# Patient Record
Sex: Female | Born: 1970
Health system: Southern US, Community
[De-identification: ages and names within clinical notes are randomized; demographics above are authoritative.]

## PROBLEM LIST (undated history)

## (undated) DIAGNOSIS — K5792 Diverticulitis of intestine, part unspecified, without perforation or abscess without bleeding: Secondary | ICD-10-CM

## (undated) HISTORY — PX: CHOLECYSTECTOMY: SHX55

---

## 2004-10-25 ENCOUNTER — Emergency Department: Payer: Self-pay | Admitting: Emergency Medicine

## 2013-10-04 ENCOUNTER — Ambulatory Visit: Payer: Self-pay | Admitting: Gastroenterology

## 2015-11-09 DIAGNOSIS — M7061 Trochanteric bursitis, right hip: Secondary | ICD-10-CM | POA: Diagnosis not present

## 2015-11-09 DIAGNOSIS — M5416 Radiculopathy, lumbar region: Secondary | ICD-10-CM | POA: Diagnosis not present

## 2015-11-20 DIAGNOSIS — D229 Melanocytic nevi, unspecified: Secondary | ICD-10-CM | POA: Diagnosis not present

## 2015-11-20 DIAGNOSIS — D225 Melanocytic nevi of trunk: Secondary | ICD-10-CM | POA: Diagnosis not present

## 2015-11-20 DIAGNOSIS — D281 Benign neoplasm of vagina: Secondary | ICD-10-CM | POA: Diagnosis not present

## 2015-12-18 DIAGNOSIS — M5412 Radiculopathy, cervical region: Secondary | ICD-10-CM | POA: Diagnosis not present

## 2015-12-18 DIAGNOSIS — M9901 Segmental and somatic dysfunction of cervical region: Secondary | ICD-10-CM | POA: Diagnosis not present

## 2015-12-20 ENCOUNTER — Emergency Department
Admission: EM | Admit: 2015-12-20 | Discharge: 2015-12-20 | Disposition: A | Discharge status: Home / Self Care | Payer: BLUE CROSS/BLUE SHIELD | Attending: Emergency Medicine | Admitting: Emergency Medicine

## 2015-12-20 ENCOUNTER — Emergency Department: Payer: BLUE CROSS/BLUE SHIELD

## 2015-12-20 ENCOUNTER — Encounter: Payer: Self-pay | Admitting: Emergency Medicine

## 2015-12-20 DIAGNOSIS — Y999 Unspecified external cause status: Secondary | ICD-10-CM | POA: Insufficient documentation

## 2015-12-20 DIAGNOSIS — Y939 Activity, unspecified: Secondary | ICD-10-CM | POA: Insufficient documentation

## 2015-12-20 DIAGNOSIS — M542 Cervicalgia: Secondary | ICD-10-CM | POA: Diagnosis not present

## 2015-12-20 DIAGNOSIS — F17200 Nicotine dependence, unspecified, uncomplicated: Secondary | ICD-10-CM | POA: Insufficient documentation

## 2015-12-20 DIAGNOSIS — X58XXXA Exposure to other specified factors, initial encounter: Secondary | ICD-10-CM | POA: Diagnosis not present

## 2015-12-20 DIAGNOSIS — Y929 Unspecified place or not applicable: Secondary | ICD-10-CM | POA: Diagnosis not present

## 2015-12-20 DIAGNOSIS — S46912A Strain of unspecified muscle, fascia and tendon at shoulder and upper arm level, left arm, initial encounter: Secondary | ICD-10-CM | POA: Insufficient documentation

## 2015-12-20 DIAGNOSIS — M25512 Pain in left shoulder: Secondary | ICD-10-CM | POA: Diagnosis not present

## 2015-12-20 MED ORDER — HYDROCODONE-ACETAMINOPHEN 5-325 MG PO TABS: 1.0000 | ORAL_TABLET | ORAL | Status: AC | PRN

## 2015-12-20 MED ORDER — CYCLOBENZAPRINE HCL 10 MG PO TABS: 10.0000 mg | ORAL_TABLET | Freq: Three times a day (TID) | ORAL | Status: AC | PRN

## 2015-12-20 MED ORDER — KETOROLAC TROMETHAMINE 60 MG/2ML IM SOLN
60.0000 mg | Freq: Once | INTRAMUSCULAR | Status: AC
  Administered 2015-12-20: 60 mg via INTRAMUSCULAR
  Filled 2015-12-20: qty 2

## 2015-12-20 MED ORDER — IBUPROFEN 800 MG PO TABS: 800.0000 mg | ORAL_TABLET | Freq: Three times a day (TID) | ORAL | Status: AC | PRN

## 2015-12-20 MED ORDER — DIAZEPAM 5 MG/ML IJ SOLN
INTRAMUSCULAR | Status: AC
  Administered 2015-12-20: 5 mg via INTRAMUSCULAR
  Filled 2015-12-20: qty 2

## 2015-12-20 MED ORDER — DIAZEPAM 5 MG/ML IJ SOLN
5.0000 mg | Freq: Once | INTRAMUSCULAR | Status: AC
  Administered 2015-12-20: 5 mg via INTRAMUSCULAR

## 2015-12-20 NOTE — Discharge Instructions (Signed)

## 2015-12-20 NOTE — ED Notes (Signed)
Pt from home c/o neck pain since yesterday. Pt states the pain is on the left side of the neck radiating to front of neck and down her back. Pt states she also has a rash on her left leg. Pt alert & oriented with mild distress noted.

## 2015-12-20 NOTE — ED Provider Notes (Signed)
[facility] Emergency Department Provider Note  ____________________________________________  Time seen: Approximately 10:30 AM  I have reviewed the triage vital signs and the nursing notes.   HISTORY  Chief Complaint Neck Pain and Shoulder Pain    HPI Miranda Daniels is a 45 y.o. female presents for evaluation of sudden onset left shoulder and arm pain and neck pain that started around 9:30 last night. Patient states that very tender to touch and worse with movement. Patient states that she didn't no excessive lifting yesterday and over the subsequent. Her left some milk but she said that he onset pain pain is worse when she tries to rotate her neck or when she tries to extend or abduct her arm. Rates her pain is 6/10 at this time.   History reviewed. No pertinent past medical history.  There are no active problems to display for this patient.   Past Surgical History  Procedure Laterality Date  . Cesarean section    . Cholecystectomy      Current Outpatient Rx  Name  Route  Sig  Dispense  Refill  . cyclobenzaprine (FLEXERIL) 10 MG tablet   Oral   Take 1 tablet (10 mg total) by mouth every 8 (eight) hours as needed for muscle spasms.   30 tablet   1   . HYDROcodone-acetaminophen (NORCO) 5-325 MG tablet   Oral   Take 1-2 tablets by mouth every 4 (four) hours as needed for moderate pain.   10 tablet   0   . ibuprofen (ADVIL,MOTRIN) 800 MG tablet   Oral   Take 1 tablet (800 mg total) by mouth every 8 (eight) hours as needed.   30 tablet   0     Allergies Prednisone  History reviewed. No pertinent family history.  Social History Social History  Substance Use Topics  . Smoking status: Current Every Day Smoker -- 1.00 packs/day  . Smokeless tobacco: None  . Alcohol Use: No     Comment: occasional    Review of Systems Constitutional: No fever/chills Eyes: No visual changes. ENT: No sore throat. Cardiovascular: Denies chest  pain. Respiratory: Denies shortness of breath. Musculoskeletal: Positive for neck and left shoulder pain. Skin: Negative for rash. Neurological: Negative for headaches, focal weakness or numbness.  10-point ROS otherwise negative.  ____________________________________________   PHYSICAL EXAM:  VITAL SIGNS: ED Triage Vitals  Enc Vitals Group     BP 12/20/15 0930 151/79 mmHg     Pulse Rate 12/20/15 0930 85     Resp 12/20/15 0930 22     Temp 12/20/15 0930 98.1 F (36.7 C)     Temp Source 12/20/15 0930 Oral     SpO2 12/20/15 0930 96 %     Weight 12/20/15 0930 284 lb (128.822 kg)     Height 12/20/15 0930  (1.676 m)     Head Cir --      Peak Flow --      Pain Score 12/20/15 0930 6     Pain Loc --      Pain Edu? --      Excl. in GC? --     Constitutional: Alert and oriented. Well appearing and in no acute distress. Eyes: Conjunctivae are normal. PERRL. EOMI. Head: Atraumatic. Nose: No congestion/rhinnorhea. Mouth/Throat: Mucous membranes are moist.  Oropharynx non-erythematous. Neck: No stridor.  Range of motion increased pain with lateral abduction to the left. Cardiovascular: Normal rate, regular rhythm. Grossly normal heart sounds.  Good peripheral circulation. Distally  neurovascularly intact upper extremity and lower extremity Respiratory: Normal respiratory effort.  No retractions. Lungs CTAB. Musculoskeletal: No lower extremity tenderness nor edema.  No joint effusions. Neurologic:  Normal speech and language. No gross focal neurologic deficits are appreciated. No gait instability. Skin:  Skin is warm, dry and intact. No rash noted. Psychiatric: Mood and affect are normal. Speech and behavior are normal.  ____________________________________________   LABS (all labs ordered are listed, but only abnormal results are displayed)  Labs Reviewed - No data to display ____________________________________________  EKG  Normal sinus rhythm with no acute  STEMI. ____________________________________________  RADIOLOGY  Left shoulder negative for any acute osseous findings. ____________________________________________   PROCEDURES  Procedure(s) performed: None  Critical Care performed: No  ____________________________________________   INITIAL IMPRESSION / ASSESSMENT AND PLAN / ED COURSE  Pertinent labs & imaging results that were available during my care of the patient were reviewed by me and considered in my medical decision making (see chart for details).  Acute cervical myofascial strain with radiation down to the arm. Patient was given Toradol 60 mg and I am 5 mg IM while in the ED. ____________________________________________   FINAL CLINICAL IMPRESSION(S) / ED DIAGNOSES  Final diagnoses:  Shoulder strain, left, initial encounter     This chart was dictated using voice recognition software/Dragon. Despite best efforts to proofread, errors can occur which can change the meaning. Any change was purely unintentional.   Evangeline Dakinharles M Sagal Gayton, PA-C 12/20/15 1108  Arnaldo NatalPaul F Malinda, MD 12/20/15 [phone]

## 2015-12-20 NOTE — ED Notes (Signed)
Pt with arm, neck, Left shoulder and left arm pain that started suddenly last night around 2130.  Pain remains and patient is very tender to touch and when moving.

## 2015-12-27 DIAGNOSIS — L299 Pruritus, unspecified: Secondary | ICD-10-CM | POA: Diagnosis not present

## 2015-12-27 DIAGNOSIS — M722 Plantar fascial fibromatosis: Secondary | ICD-10-CM | POA: Diagnosis not present

## 2015-12-27 DIAGNOSIS — M7731 Calcaneal spur, right foot: Secondary | ICD-10-CM | POA: Diagnosis not present

## 2015-12-27 DIAGNOSIS — M79671 Pain in right foot: Secondary | ICD-10-CM | POA: Diagnosis not present

## 2015-12-27 DIAGNOSIS — B354 Tinea corporis: Secondary | ICD-10-CM | POA: Diagnosis not present

## 2015-12-27 DIAGNOSIS — M79672 Pain in left foot: Secondary | ICD-10-CM | POA: Diagnosis not present

## 2016-01-11 DIAGNOSIS — R52 Pain, unspecified: Secondary | ICD-10-CM | POA: Diagnosis not present

## 2016-01-11 DIAGNOSIS — L259 Unspecified contact dermatitis, unspecified cause: Secondary | ICD-10-CM | POA: Diagnosis not present

## 2016-01-24 DIAGNOSIS — M722 Plantar fascial fibromatosis: Secondary | ICD-10-CM | POA: Diagnosis not present

## 2016-01-24 DIAGNOSIS — M76821 Posterior tibial tendinitis, right leg: Secondary | ICD-10-CM | POA: Diagnosis not present

## 2016-01-24 DIAGNOSIS — M7751 Other enthesopathy of right foot: Secondary | ICD-10-CM | POA: Diagnosis not present

## 2016-01-24 DIAGNOSIS — M76822 Posterior tibial tendinitis, left leg: Secondary | ICD-10-CM | POA: Diagnosis not present

## 2016-05-14 DIAGNOSIS — D2222 Melanocytic nevi of left ear and external auricular canal: Secondary | ICD-10-CM | POA: Diagnosis not present

## 2016-05-14 DIAGNOSIS — L918 Other hypertrophic disorders of the skin: Secondary | ICD-10-CM | POA: Diagnosis not present

## 2016-05-14 DIAGNOSIS — D485 Neoplasm of uncertain behavior of skin: Secondary | ICD-10-CM | POA: Diagnosis not present

## 2016-05-14 DIAGNOSIS — L309 Dermatitis, unspecified: Secondary | ICD-10-CM | POA: Diagnosis not present

## 2016-09-02 DIAGNOSIS — B354 Tinea corporis: Secondary | ICD-10-CM | POA: Diagnosis not present

## 2016-09-02 DIAGNOSIS — F17200 Nicotine dependence, unspecified, uncomplicated: Secondary | ICD-10-CM | POA: Diagnosis not present

## 2016-09-02 DIAGNOSIS — Z716 Tobacco abuse counseling: Secondary | ICD-10-CM | POA: Diagnosis not present

## 2016-09-02 DIAGNOSIS — J3489 Other specified disorders of nose and nasal sinuses: Secondary | ICD-10-CM | POA: Diagnosis not present

## 2016-09-18 ENCOUNTER — Other Ambulatory Visit: Payer: Self-pay | Admitting: Obstetrics and Gynecology

## 2016-09-18 DIAGNOSIS — Z716 Tobacco abuse counseling: Secondary | ICD-10-CM | POA: Diagnosis not present

## 2016-09-18 DIAGNOSIS — K5790 Diverticulosis of intestine, part unspecified, without perforation or abscess without bleeding: Secondary | ICD-10-CM | POA: Diagnosis not present

## 2016-09-18 DIAGNOSIS — Z1231 Encounter for screening mammogram for malignant neoplasm of breast: Secondary | ICD-10-CM

## 2016-09-18 DIAGNOSIS — Z0184 Encounter for antibody response examination: Secondary | ICD-10-CM | POA: Diagnosis not present

## 2016-09-18 DIAGNOSIS — R6882 Decreased libido: Secondary | ICD-10-CM | POA: Diagnosis not present

## 2016-09-18 DIAGNOSIS — Z1151 Encounter for screening for human papillomavirus (HPV): Secondary | ICD-10-CM | POA: Diagnosis not present

## 2016-09-18 DIAGNOSIS — Z01419 Encounter for gynecological examination (general) (routine) without abnormal findings: Secondary | ICD-10-CM | POA: Diagnosis not present

## 2016-09-18 DIAGNOSIS — Z23 Encounter for immunization: Secondary | ICD-10-CM | POA: Diagnosis not present

## 2016-09-18 DIAGNOSIS — Z1211 Encounter for screening for malignant neoplasm of colon: Secondary | ICD-10-CM | POA: Diagnosis not present

## 2016-09-18 DIAGNOSIS — Z1389 Encounter for screening for other disorder: Secondary | ICD-10-CM | POA: Diagnosis not present

## 2016-09-18 DIAGNOSIS — Z124 Encounter for screening for malignant neoplasm of cervix: Secondary | ICD-10-CM | POA: Diagnosis not present

## 2016-09-20 DIAGNOSIS — H40013 Open angle with borderline findings, low risk, bilateral: Secondary | ICD-10-CM | POA: Diagnosis not present

## 2016-10-08 ENCOUNTER — Ambulatory Visit
Admission: RE | Admit: 2016-10-08 | Discharge: 2016-10-08 | Disposition: A | Discharge status: Home / Self Care | Payer: BLUE CROSS/BLUE SHIELD | Source: Ambulatory Visit | Attending: Obstetrics and Gynecology | Admitting: Obstetrics and Gynecology

## 2016-10-08 DIAGNOSIS — Z1231 Encounter for screening mammogram for malignant neoplasm of breast: Secondary | ICD-10-CM

## 2016-10-14 DIAGNOSIS — L300 Nummular dermatitis: Secondary | ICD-10-CM | POA: Diagnosis not present

## 2016-10-14 DIAGNOSIS — L299 Pruritus, unspecified: Secondary | ICD-10-CM | POA: Diagnosis not present

## 2016-10-14 DIAGNOSIS — D224 Melanocytic nevi of scalp and neck: Secondary | ICD-10-CM | POA: Diagnosis not present

## 2016-10-14 DIAGNOSIS — I8310 Varicose veins of unspecified lower extremity with inflammation: Secondary | ICD-10-CM | POA: Diagnosis not present

## 2016-10-16 DIAGNOSIS — F17200 Nicotine dependence, unspecified, uncomplicated: Secondary | ICD-10-CM | POA: Diagnosis not present

## 2016-10-16 DIAGNOSIS — Z716 Tobacco abuse counseling: Secondary | ICD-10-CM | POA: Diagnosis not present

## 2016-10-16 DIAGNOSIS — Z0000 Encounter for general adult medical examination without abnormal findings: Secondary | ICD-10-CM | POA: Diagnosis not present

## 2016-10-28 DIAGNOSIS — Z6841 Body Mass Index (BMI) 40.0 and over, adult: Secondary | ICD-10-CM | POA: Diagnosis not present

## 2016-10-28 DIAGNOSIS — Z23 Encounter for immunization: Secondary | ICD-10-CM | POA: Diagnosis not present

## 2016-10-28 DIAGNOSIS — Z0289 Encounter for other administrative examinations: Secondary | ICD-10-CM | POA: Diagnosis not present

## 2016-11-19 DIAGNOSIS — H40013 Open angle with borderline findings, low risk, bilateral: Secondary | ICD-10-CM | POA: Diagnosis not present

## 2016-12-06 DIAGNOSIS — Z6841 Body Mass Index (BMI) 40.0 and over, adult: Secondary | ICD-10-CM | POA: Diagnosis not present

## 2016-12-06 DIAGNOSIS — H6592 Unspecified nonsuppurative otitis media, left ear: Secondary | ICD-10-CM | POA: Diagnosis not present

## 2016-12-20 DIAGNOSIS — H6980 Other specified disorders of Eustachian tube, unspecified ear: Secondary | ICD-10-CM | POA: Diagnosis not present

## 2016-12-20 DIAGNOSIS — Z6841 Body Mass Index (BMI) 40.0 and over, adult: Secondary | ICD-10-CM | POA: Diagnosis not present

## 2017-01-27 DIAGNOSIS — Z23 Encounter for immunization: Secondary | ICD-10-CM | POA: Diagnosis not present

## 2017-02-24 DIAGNOSIS — H40053 Ocular hypertension, bilateral: Secondary | ICD-10-CM | POA: Diagnosis not present

## 2017-07-30 DIAGNOSIS — M9901 Segmental and somatic dysfunction of cervical region: Secondary | ICD-10-CM | POA: Diagnosis not present

## 2017-07-30 DIAGNOSIS — M5412 Radiculopathy, cervical region: Secondary | ICD-10-CM | POA: Diagnosis not present

## 2017-08-05 DIAGNOSIS — M5412 Radiculopathy, cervical region: Secondary | ICD-10-CM | POA: Diagnosis not present

## 2017-08-05 DIAGNOSIS — M9901 Segmental and somatic dysfunction of cervical region: Secondary | ICD-10-CM | POA: Diagnosis not present

## 2017-08-27 DIAGNOSIS — M9901 Segmental and somatic dysfunction of cervical region: Secondary | ICD-10-CM | POA: Diagnosis not present

## 2017-08-27 DIAGNOSIS — M5412 Radiculopathy, cervical region: Secondary | ICD-10-CM | POA: Diagnosis not present

## 2017-10-20 DIAGNOSIS — R5383 Other fatigue: Secondary | ICD-10-CM | POA: Diagnosis not present

## 2017-12-09 DIAGNOSIS — H40053 Ocular hypertension, bilateral: Secondary | ICD-10-CM | POA: Diagnosis not present

## 2018-02-02 DIAGNOSIS — L578 Other skin changes due to chronic exposure to nonionizing radiation: Secondary | ICD-10-CM | POA: Diagnosis not present

## 2018-02-02 DIAGNOSIS — L570 Actinic keratosis: Secondary | ICD-10-CM | POA: Diagnosis not present

## 2018-02-02 DIAGNOSIS — D2239 Melanocytic nevi of other parts of face: Secondary | ICD-10-CM | POA: Diagnosis not present

## 2018-02-09 IMAGING — CR DG SHOULDER 2+V*L*
1 series · 3 of 3 positions shown · non-contrast
Comparison: None.

CLINICAL DATA: Left shoulder pain and decreased range of motion
starting last night, no known injury

EXAM:
LEFT SHOULDER - 2+ VIEW

[Series 1: dg shoulder left · 0.14mm/px · 3 of 3 slices shown]
[im 1/3]
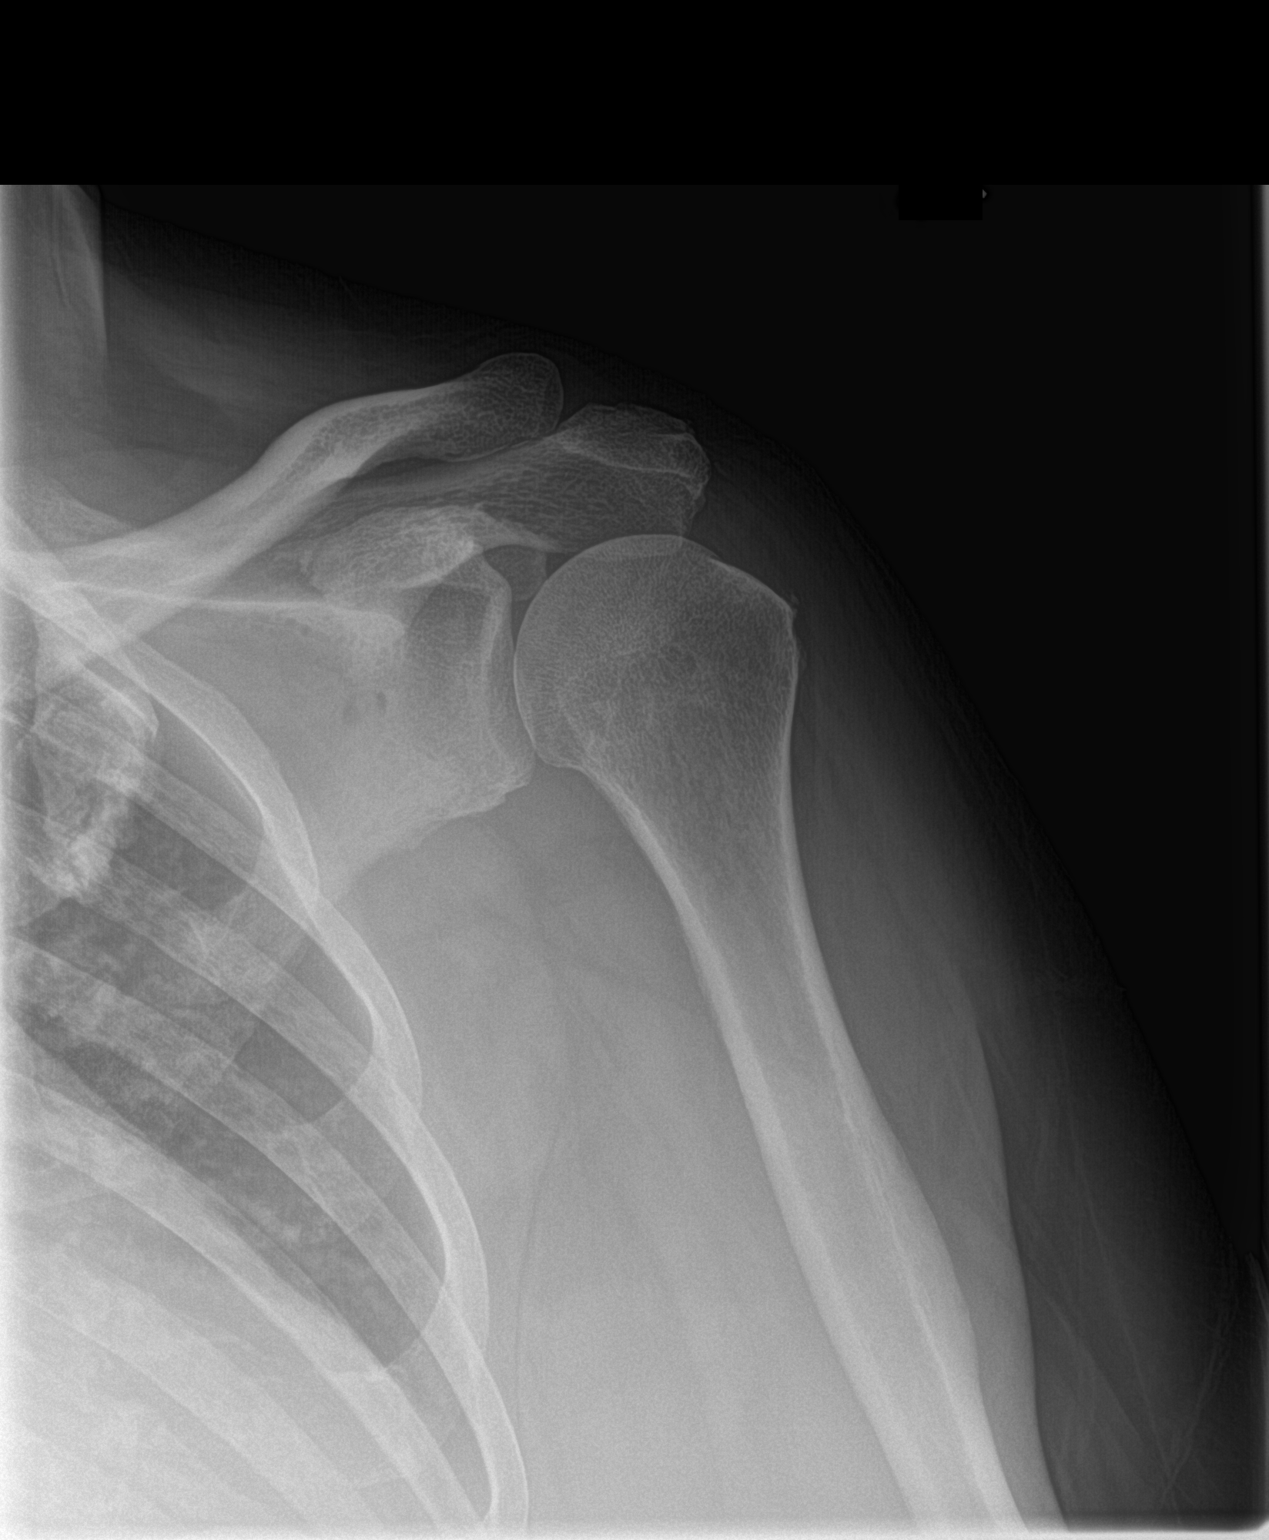
[im 2/3]
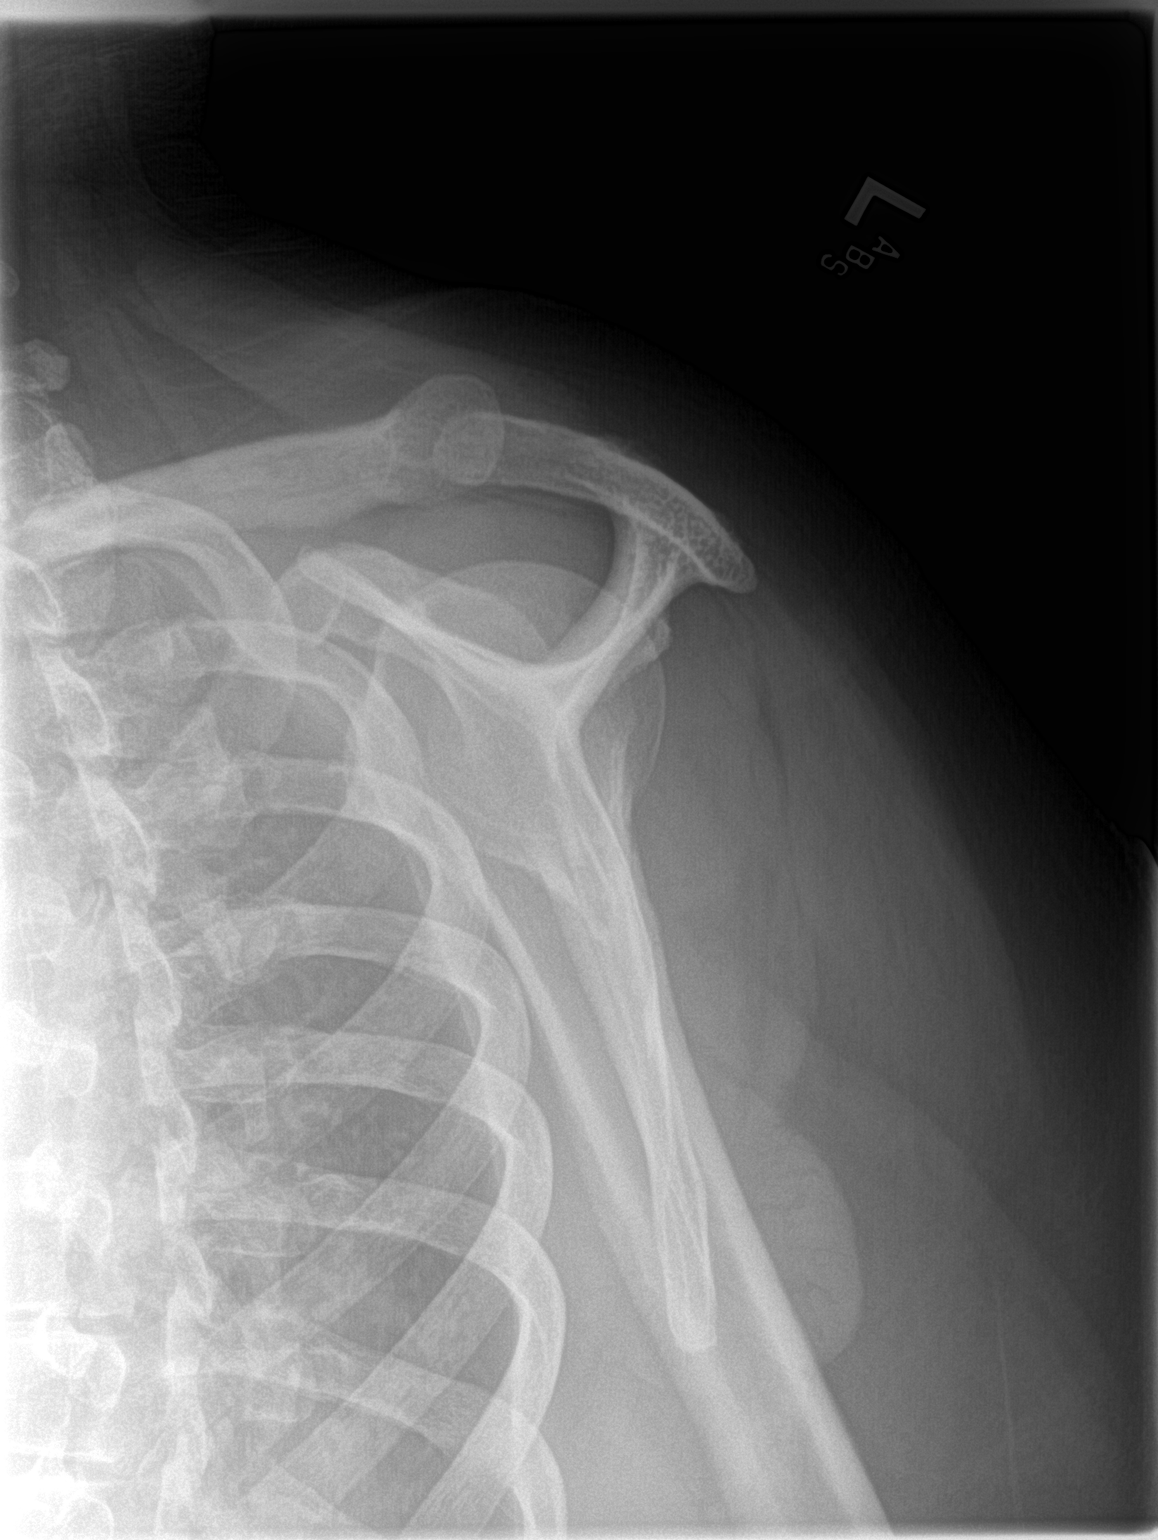
[im 3/3]
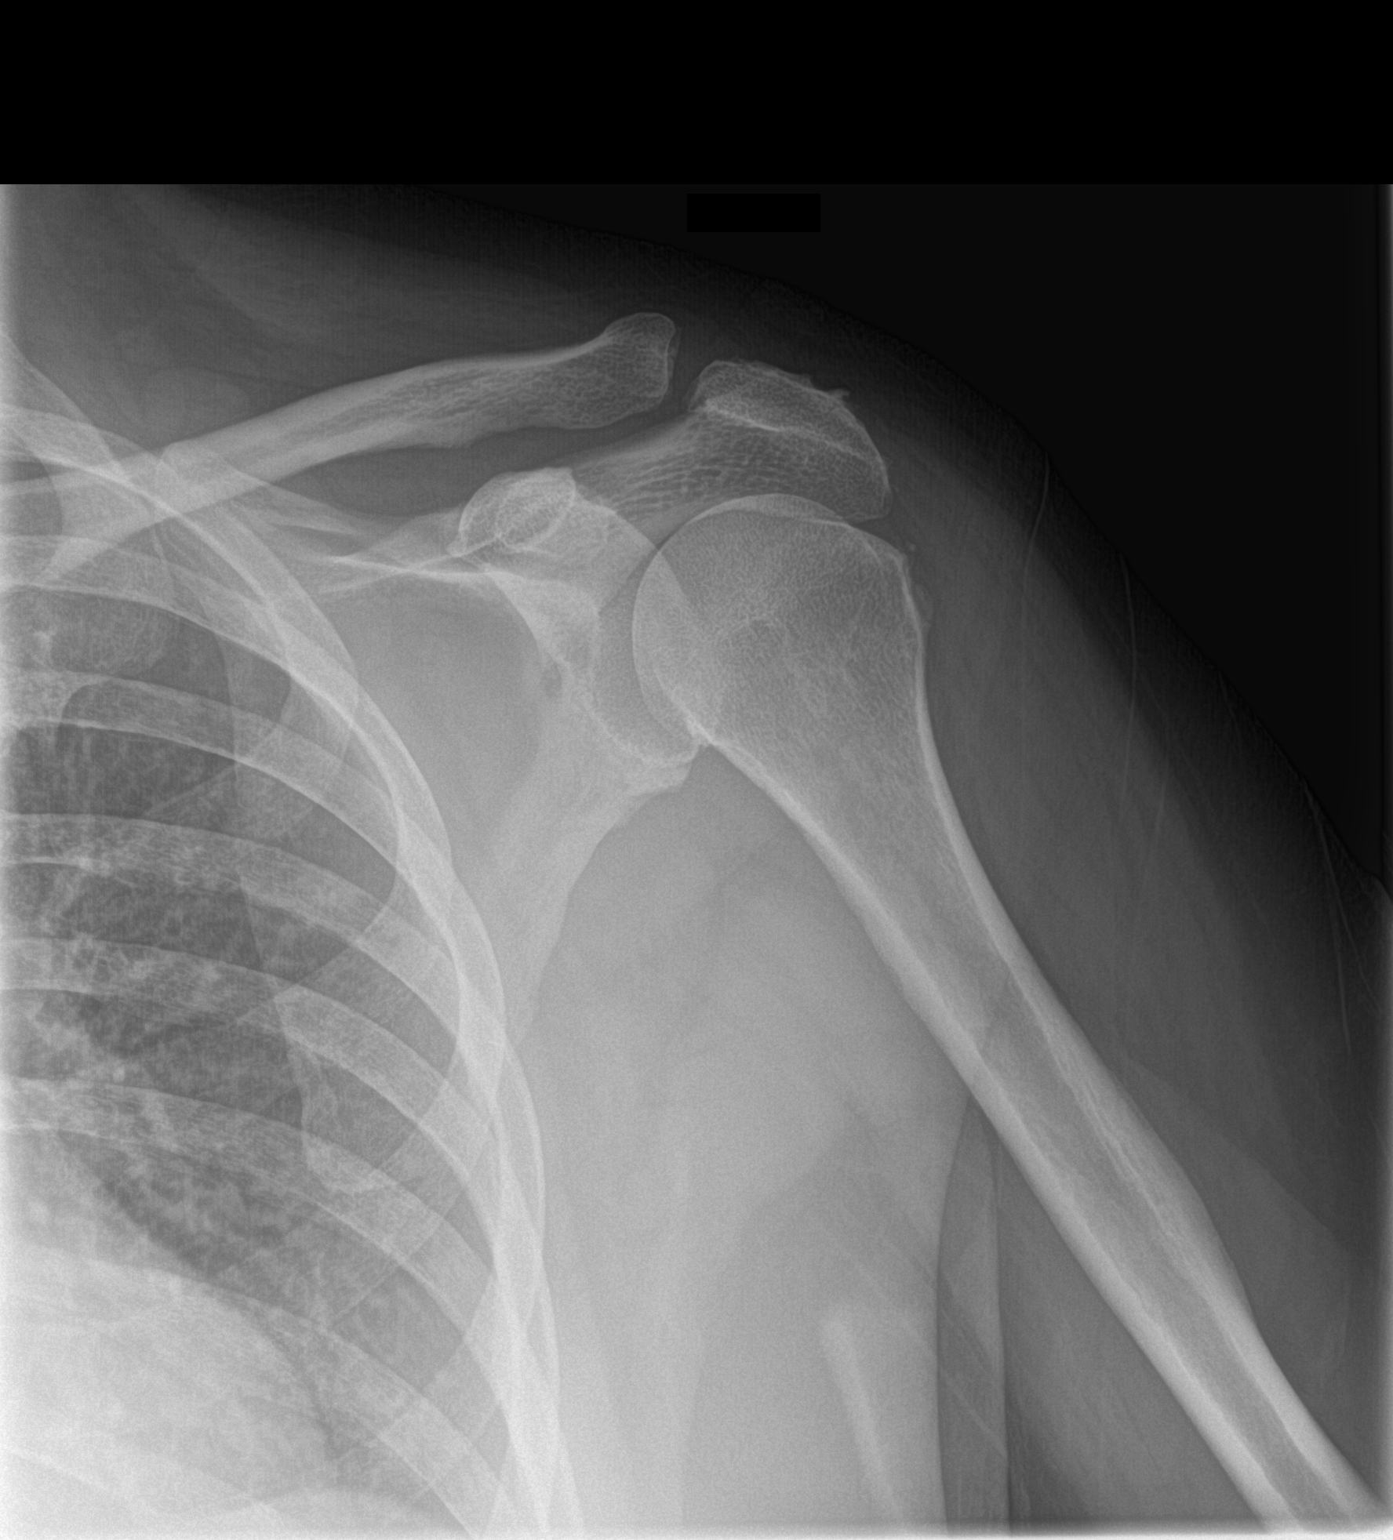

[3 of 3 positions shown; findings below may reference images not displayed]

FINDINGS: Three views of the left shoulder submitted. No acute fracture or
subluxation. AC joint and glenohumeral joint are preserved. No
pathologic calcifications are noted.
IMPRESSION: Negative.

## 2018-04-17 DIAGNOSIS — M5416 Radiculopathy, lumbar region: Secondary | ICD-10-CM | POA: Diagnosis not present

## 2018-05-05 ENCOUNTER — Other Ambulatory Visit: Payer: Self-pay | Admitting: Student

## 2018-05-05 DIAGNOSIS — M5416 Radiculopathy, lumbar region: Secondary | ICD-10-CM

## 2018-05-05 DIAGNOSIS — M5441 Lumbago with sciatica, right side: Secondary | ICD-10-CM

## 2018-05-21 ENCOUNTER — Ambulatory Visit
Admission: RE | Admit: 2018-05-21 | Discharge: 2018-05-21 | Disposition: A | Discharge status: Home / Self Care | Payer: BLUE CROSS/BLUE SHIELD | Source: Ambulatory Visit | Attending: Student | Admitting: Student

## 2018-05-21 DIAGNOSIS — M545 Low back pain: Secondary | ICD-10-CM | POA: Diagnosis not present

## 2018-05-21 DIAGNOSIS — M47816 Spondylosis without myelopathy or radiculopathy, lumbar region: Secondary | ICD-10-CM | POA: Diagnosis not present

## 2018-05-21 DIAGNOSIS — M47819 Spondylosis without myelopathy or radiculopathy, site unspecified: Secondary | ICD-10-CM | POA: Diagnosis not present

## 2018-05-21 DIAGNOSIS — M5416 Radiculopathy, lumbar region: Secondary | ICD-10-CM | POA: Diagnosis not present

## 2018-05-21 DIAGNOSIS — M5441 Lumbago with sciatica, right side: Secondary | ICD-10-CM

## 2018-05-21 DIAGNOSIS — M5126 Other intervertebral disc displacement, lumbar region: Secondary | ICD-10-CM | POA: Insufficient documentation

## 2018-12-14 DIAGNOSIS — M79651 Pain in right thigh: Secondary | ICD-10-CM | POA: Diagnosis not present

## 2018-12-14 DIAGNOSIS — Z6841 Body Mass Index (BMI) 40.0 and over, adult: Secondary | ICD-10-CM | POA: Diagnosis not present

## 2019-01-15 DIAGNOSIS — M5136 Other intervertebral disc degeneration, lumbar region: Secondary | ICD-10-CM | POA: Diagnosis not present

## 2019-01-15 DIAGNOSIS — M5416 Radiculopathy, lumbar region: Secondary | ICD-10-CM | POA: Diagnosis not present

## 2019-02-12 DIAGNOSIS — M5416 Radiculopathy, lumbar region: Secondary | ICD-10-CM | POA: Diagnosis not present

## 2019-02-12 DIAGNOSIS — M5136 Other intervertebral disc degeneration, lumbar region: Secondary | ICD-10-CM | POA: Diagnosis not present

## 2019-03-03 DIAGNOSIS — M5441 Lumbago with sciatica, right side: Secondary | ICD-10-CM | POA: Diagnosis not present

## 2019-03-03 DIAGNOSIS — M7061 Trochanteric bursitis, right hip: Secondary | ICD-10-CM | POA: Diagnosis not present

## 2019-03-03 DIAGNOSIS — M5416 Radiculopathy, lumbar region: Secondary | ICD-10-CM | POA: Diagnosis not present

## 2019-04-13 DIAGNOSIS — M79604 Pain in right leg: Secondary | ICD-10-CM | POA: Diagnosis not present

## 2019-05-03 DIAGNOSIS — M47816 Spondylosis without myelopathy or radiculopathy, lumbar region: Secondary | ICD-10-CM | POA: Diagnosis not present

## 2019-05-03 DIAGNOSIS — M79604 Pain in right leg: Secondary | ICD-10-CM | POA: Diagnosis not present

## 2019-05-03 DIAGNOSIS — G5711 Meralgia paresthetica, right lower limb: Secondary | ICD-10-CM | POA: Diagnosis not present

## 2019-05-10 DIAGNOSIS — Z0000 Encounter for general adult medical examination without abnormal findings: Secondary | ICD-10-CM | POA: Diagnosis not present

## 2019-05-10 DIAGNOSIS — Z13220 Encounter for screening for lipoid disorders: Secondary | ICD-10-CM | POA: Diagnosis not present

## 2019-05-10 DIAGNOSIS — Z1331 Encounter for screening for depression: Secondary | ICD-10-CM | POA: Diagnosis not present

## 2019-05-10 DIAGNOSIS — Z23 Encounter for immunization: Secondary | ICD-10-CM | POA: Diagnosis not present

## 2019-05-10 DIAGNOSIS — Z111 Encounter for screening for respiratory tuberculosis: Secondary | ICD-10-CM | POA: Diagnosis not present

## 2019-05-14 DIAGNOSIS — Z789 Other specified health status: Secondary | ICD-10-CM | POA: Diagnosis not present

## 2019-06-16 DIAGNOSIS — Z23 Encounter for immunization: Secondary | ICD-10-CM | POA: Diagnosis not present

## 2019-06-16 DIAGNOSIS — Z111 Encounter for screening for respiratory tuberculosis: Secondary | ICD-10-CM | POA: Diagnosis not present

## 2019-08-09 DIAGNOSIS — Z87891 Personal history of nicotine dependence: Secondary | ICD-10-CM | POA: Diagnosis not present

## 2019-08-09 DIAGNOSIS — Z6841 Body Mass Index (BMI) 40.0 and over, adult: Secondary | ICD-10-CM | POA: Diagnosis not present

## 2019-08-09 DIAGNOSIS — Z713 Dietary counseling and surveillance: Secondary | ICD-10-CM | POA: Diagnosis not present

## 2019-08-13 DIAGNOSIS — G5711 Meralgia paresthetica, right lower limb: Secondary | ICD-10-CM | POA: Diagnosis not present

## 2019-08-13 DIAGNOSIS — M76899 Other specified enthesopathies of unspecified lower limb, excluding foot: Secondary | ICD-10-CM | POA: Diagnosis not present

## 2019-08-24 DIAGNOSIS — M79604 Pain in right leg: Secondary | ICD-10-CM | POA: Diagnosis not present

## 2019-08-24 DIAGNOSIS — G5711 Meralgia paresthetica, right lower limb: Secondary | ICD-10-CM | POA: Diagnosis not present

## 2019-09-07 DIAGNOSIS — G5711 Meralgia paresthetica, right lower limb: Secondary | ICD-10-CM | POA: Diagnosis not present

## 2019-09-07 DIAGNOSIS — M25561 Pain in right knee: Secondary | ICD-10-CM | POA: Diagnosis not present

## 2019-09-07 DIAGNOSIS — M79604 Pain in right leg: Secondary | ICD-10-CM | POA: Diagnosis not present

## 2019-09-07 DIAGNOSIS — M25851 Other specified joint disorders, right hip: Secondary | ICD-10-CM | POA: Diagnosis not present

## 2019-09-10 DIAGNOSIS — Z713 Dietary counseling and surveillance: Secondary | ICD-10-CM | POA: Diagnosis not present

## 2019-09-10 DIAGNOSIS — Z6841 Body Mass Index (BMI) 40.0 and over, adult: Secondary | ICD-10-CM | POA: Diagnosis not present

## 2019-10-08 DIAGNOSIS — Z713 Dietary counseling and surveillance: Secondary | ICD-10-CM | POA: Diagnosis not present

## 2019-10-08 DIAGNOSIS — Z6841 Body Mass Index (BMI) 40.0 and over, adult: Secondary | ICD-10-CM | POA: Diagnosis not present

## 2019-12-08 DIAGNOSIS — Z713 Dietary counseling and surveillance: Secondary | ICD-10-CM | POA: Diagnosis not present

## 2019-12-08 DIAGNOSIS — Z6841 Body Mass Index (BMI) 40.0 and over, adult: Secondary | ICD-10-CM | POA: Diagnosis not present

## 2019-12-14 DIAGNOSIS — L309 Dermatitis, unspecified: Secondary | ICD-10-CM | POA: Diagnosis not present

## 2019-12-14 DIAGNOSIS — Z6841 Body Mass Index (BMI) 40.0 and over, adult: Secondary | ICD-10-CM | POA: Diagnosis not present

## 2020-01-11 DIAGNOSIS — Z87891 Personal history of nicotine dependence: Secondary | ICD-10-CM | POA: Diagnosis not present

## 2020-01-11 DIAGNOSIS — Z713 Dietary counseling and surveillance: Secondary | ICD-10-CM | POA: Diagnosis not present

## 2020-01-11 DIAGNOSIS — Z6841 Body Mass Index (BMI) 40.0 and over, adult: Secondary | ICD-10-CM | POA: Diagnosis not present

## 2020-03-08 DIAGNOSIS — Z87891 Personal history of nicotine dependence: Secondary | ICD-10-CM | POA: Diagnosis not present

## 2020-03-08 DIAGNOSIS — Z713 Dietary counseling and surveillance: Secondary | ICD-10-CM | POA: Diagnosis not present

## 2020-03-08 DIAGNOSIS — Z6841 Body Mass Index (BMI) 40.0 and over, adult: Secondary | ICD-10-CM | POA: Diagnosis not present

## 2020-03-09 DIAGNOSIS — D229 Melanocytic nevi, unspecified: Secondary | ICD-10-CM | POA: Diagnosis not present

## 2020-03-09 DIAGNOSIS — L821 Other seborrheic keratosis: Secondary | ICD-10-CM | POA: Diagnosis not present

## 2020-03-29 DIAGNOSIS — Z111 Encounter for screening for respiratory tuberculosis: Secondary | ICD-10-CM | POA: Diagnosis not present

## 2020-05-26 DIAGNOSIS — Z23 Encounter for immunization: Secondary | ICD-10-CM | POA: Diagnosis not present

## 2020-05-29 DIAGNOSIS — S76012A Strain of muscle, fascia and tendon of left hip, initial encounter: Secondary | ICD-10-CM | POA: Diagnosis not present

## 2020-06-13 DIAGNOSIS — Z6841 Body Mass Index (BMI) 40.0 and over, adult: Secondary | ICD-10-CM | POA: Diagnosis not present

## 2020-06-13 DIAGNOSIS — M62830 Muscle spasm of back: Secondary | ICD-10-CM | POA: Diagnosis not present

## 2020-09-18 DIAGNOSIS — M25511 Pain in right shoulder: Secondary | ICD-10-CM | POA: Diagnosis not present

## 2020-09-18 DIAGNOSIS — M7531 Calcific tendinitis of right shoulder: Secondary | ICD-10-CM | POA: Diagnosis not present

## 2020-09-28 DIAGNOSIS — M7531 Calcific tendinitis of right shoulder: Secondary | ICD-10-CM | POA: Diagnosis not present

## 2020-09-28 DIAGNOSIS — M7551 Bursitis of right shoulder: Secondary | ICD-10-CM | POA: Diagnosis not present

## 2020-09-28 DIAGNOSIS — M25511 Pain in right shoulder: Secondary | ICD-10-CM | POA: Diagnosis not present

## 2020-09-28 DIAGNOSIS — M67911 Unspecified disorder of synovium and tendon, right shoulder: Secondary | ICD-10-CM | POA: Diagnosis not present

## 2021-01-09 DIAGNOSIS — Z6841 Body Mass Index (BMI) 40.0 and over, adult: Secondary | ICD-10-CM | POA: Diagnosis not present

## 2021-01-09 DIAGNOSIS — Z87891 Personal history of nicotine dependence: Secondary | ICD-10-CM | POA: Diagnosis not present

## 2021-01-09 DIAGNOSIS — Z713 Dietary counseling and surveillance: Secondary | ICD-10-CM | POA: Diagnosis not present

## 2021-02-08 DIAGNOSIS — Z87891 Personal history of nicotine dependence: Secondary | ICD-10-CM | POA: Diagnosis not present

## 2021-02-08 DIAGNOSIS — Z6841 Body Mass Index (BMI) 40.0 and over, adult: Secondary | ICD-10-CM | POA: Diagnosis not present

## 2021-02-08 DIAGNOSIS — Z713 Dietary counseling and surveillance: Secondary | ICD-10-CM | POA: Diagnosis not present

## 2021-03-14 DIAGNOSIS — S86811A Strain of other muscle(s) and tendon(s) at lower leg level, right leg, initial encounter: Secondary | ICD-10-CM | POA: Diagnosis not present

## 2021-03-19 DIAGNOSIS — Z1331 Encounter for screening for depression: Secondary | ICD-10-CM | POA: Diagnosis not present

## 2021-03-19 DIAGNOSIS — S86119A Strain of other muscle(s) and tendon(s) of posterior muscle group at lower leg level, unspecified leg, initial encounter: Secondary | ICD-10-CM | POA: Diagnosis not present

## 2021-03-19 DIAGNOSIS — Z6841 Body Mass Index (BMI) 40.0 and over, adult: Secondary | ICD-10-CM | POA: Diagnosis not present

## 2021-03-19 DIAGNOSIS — Z713 Dietary counseling and surveillance: Secondary | ICD-10-CM | POA: Diagnosis not present

## 2021-03-21 DIAGNOSIS — S86811D Strain of other muscle(s) and tendon(s) at lower leg level, right leg, subsequent encounter: Secondary | ICD-10-CM | POA: Diagnosis not present

## 2021-04-11 DIAGNOSIS — S86811D Strain of other muscle(s) and tendon(s) at lower leg level, right leg, subsequent encounter: Secondary | ICD-10-CM | POA: Diagnosis not present

## 2021-04-17 DIAGNOSIS — Z111 Encounter for screening for respiratory tuberculosis: Secondary | ICD-10-CM | POA: Diagnosis not present

## 2021-04-17 DIAGNOSIS — Z0289 Encounter for other administrative examinations: Secondary | ICD-10-CM | POA: Diagnosis not present

## 2021-04-17 DIAGNOSIS — Z23 Encounter for immunization: Secondary | ICD-10-CM | POA: Diagnosis not present

## 2021-04-25 DIAGNOSIS — Z111 Encounter for screening for respiratory tuberculosis: Secondary | ICD-10-CM | POA: Diagnosis not present

## 2021-04-30 DIAGNOSIS — Z111 Encounter for screening for respiratory tuberculosis: Secondary | ICD-10-CM | POA: Diagnosis not present

## 2021-06-11 DIAGNOSIS — Z6841 Body Mass Index (BMI) 40.0 and over, adult: Secondary | ICD-10-CM | POA: Diagnosis not present

## 2021-06-11 DIAGNOSIS — R109 Unspecified abdominal pain: Secondary | ICD-10-CM | POA: Diagnosis not present

## 2021-06-13 ENCOUNTER — Other Ambulatory Visit: Payer: Self-pay | Admitting: Physician Assistant

## 2021-06-13 DIAGNOSIS — R109 Unspecified abdominal pain: Secondary | ICD-10-CM

## 2021-06-16 ENCOUNTER — Emergency Department: Payer: BC Managed Care – PPO

## 2021-06-16 ENCOUNTER — Emergency Department
Admission: EM | Admit: 2021-06-16 | Discharge: 2021-06-16 | Disposition: A | Discharge status: Home / Self Care | Payer: BC Managed Care – PPO | Attending: Student in an Organized Health Care Education/Training Program | Admitting: Student in an Organized Health Care Education/Training Program

## 2021-06-16 ENCOUNTER — Other Ambulatory Visit: Payer: Self-pay

## 2021-06-16 DIAGNOSIS — R52 Pain, unspecified: Secondary | ICD-10-CM | POA: Diagnosis not present

## 2021-06-16 DIAGNOSIS — R1084 Generalized abdominal pain: Secondary | ICD-10-CM | POA: Diagnosis not present

## 2021-06-16 DIAGNOSIS — Z20822 Contact with and (suspected) exposure to covid-19: Secondary | ICD-10-CM | POA: Diagnosis not present

## 2021-06-16 DIAGNOSIS — I10 Essential (primary) hypertension: Secondary | ICD-10-CM | POA: Diagnosis not present

## 2021-06-16 DIAGNOSIS — F17210 Nicotine dependence, cigarettes, uncomplicated: Secondary | ICD-10-CM | POA: Diagnosis not present

## 2021-06-16 DIAGNOSIS — I700 Atherosclerosis of aorta: Secondary | ICD-10-CM | POA: Diagnosis not present

## 2021-06-16 DIAGNOSIS — Z9049 Acquired absence of other specified parts of digestive tract: Secondary | ICD-10-CM | POA: Diagnosis not present

## 2021-06-16 DIAGNOSIS — K5730 Diverticulosis of large intestine without perforation or abscess without bleeding: Secondary | ICD-10-CM | POA: Diagnosis not present

## 2021-06-16 DIAGNOSIS — R197 Diarrhea, unspecified: Secondary | ICD-10-CM | POA: Diagnosis not present

## 2021-06-16 HISTORY — DX: Diverticulitis of intestine, part unspecified, without perforation or abscess without bleeding: K57.92

## 2021-06-16 LAB — C DIFFICILE QUICK SCREEN W PCR REFLEX
C Diff antigen: NEGATIVE
C Diff interpretation: NOT DETECTED
C Diff toxin: NEGATIVE

## 2021-06-16 LAB — URINALYSIS, ROUTINE W REFLEX MICROSCOPIC
Bilirubin Urine: NEGATIVE
Glucose, UA: NEGATIVE mg/dL
Ketones, ur: NEGATIVE mg/dL
Leukocytes,Ua: NEGATIVE
Nitrite: NEGATIVE
Protein, ur: NEGATIVE mg/dL
Specific Gravity, Urine: >1.046 — ABNORMAL HIGH (ref 1.005–1.030)
pH: 5 (ref 5.0–8.0)

## 2021-06-16 LAB — COMPREHENSIVE METABOLIC PANEL
ALT: 15 U/L (ref 0–44)
AST: 17 U/L (ref 15–41)
Albumin: 3.8 g/dL (ref 3.5–5.0)
Alkaline Phosphatase: 69 U/L (ref 38–126)
Anion gap: 7 (ref 5–15)
BUN: 17 mg/dL (ref 6–20)
CO2: 21 mmol/L — ABNORMAL LOW (ref 22–32)
Calcium: 9.1 mg/dL (ref 8.9–10.3)
Chloride: 107 mmol/L (ref 98–111)
Creatinine, Ser: 0.84 mg/dL (ref 0.44–1.00)
GFR, Estimated: >60 mL/min (ref >60)
Glucose, Bld: 125 mg/dL — ABNORMAL HIGH (ref 70–99)
Potassium: 4.3 mmol/L (ref 3.5–5.1)
Sodium: 135 mmol/L (ref 135–145)
Total Bilirubin: 0.6 mg/dL (ref 0.3–1.2)
Total Protein: 7.8 g/dL (ref 6.5–8.1)

## 2021-06-16 LAB — GASTROINTESTINAL PANEL BY PCR, STOOL (REPLACES STOOL CULTURE)

## 2021-06-16 LAB — CBC
HCT: 47.5 % — ABNORMAL HIGH (ref 36.0–46.0)
Hemoglobin: 15.9 g/dL — ABNORMAL HIGH (ref 12.0–15.0)
MCH: 29.7 pg (ref 26.0–34.0)
MCHC: 33.5 g/dL (ref 30.0–36.0)
MCV: 88.6 fL (ref 80.0–100.0)
Platelets: 296 10*3/uL (ref 150–400)
RBC: 5.36 MIL/uL — ABNORMAL HIGH (ref 3.87–5.11)
RDW: 13 % (ref 11.5–15.5)
WBC: 16.2 10*3/uL — ABNORMAL HIGH (ref 4.0–10.5)
nRBC: 0 % (ref 0.0–0.2)

## 2021-06-16 LAB — RESP PANEL BY RT-PCR (FLU A&B, COVID) ARPGX2
Influenza A by PCR: NEGATIVE
Influenza B by PCR: NEGATIVE
SARS Coronavirus 2 by RT PCR: NEGATIVE

## 2021-06-16 LAB — LIPASE, BLOOD: Lipase: 50 U/L (ref 11–51)

## 2021-06-16 IMAGING — CT CT ABD-PELV W/ CM
1 series · 14 of 40 positions shown, 18 images · IV contrast (APPLIED)
Comparison: None.

CLINICAL DATA: Severe left abdominal and flank pain.

EXAM:
CT ABDOMEN AND PELVIS WITH CONTRAST
TECHNIQUE: Multidetector CT imaging of the abdomen and pelvis was performed
using the standard protocol following bolus administration of
intravenous contrast.
CONTRAST:  100mL OMNIPAQUE IOHEXOL 300 MG/ML  SOLN

[Series 5: coronal st · coronal · 0.98mm/px · 14 of 117 slices shown, 18 images]
[im 4/117  lung]
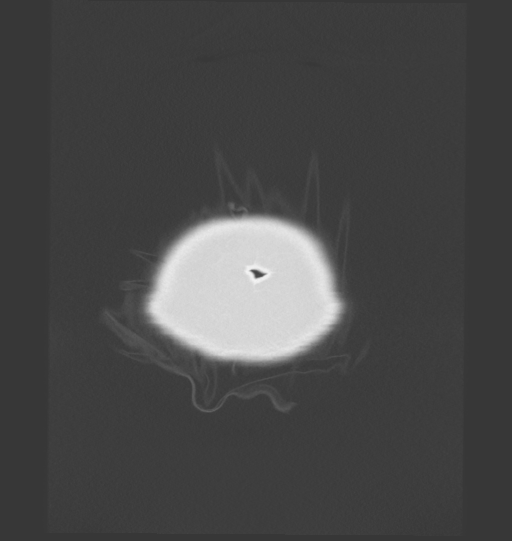
[im 8/117  soft-tissue]
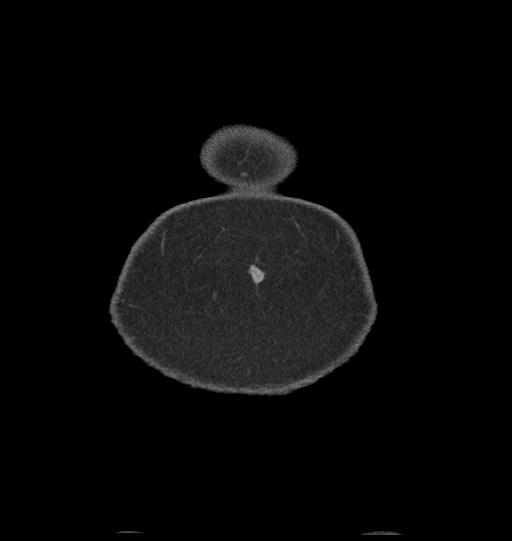
[im 8/117  lung]
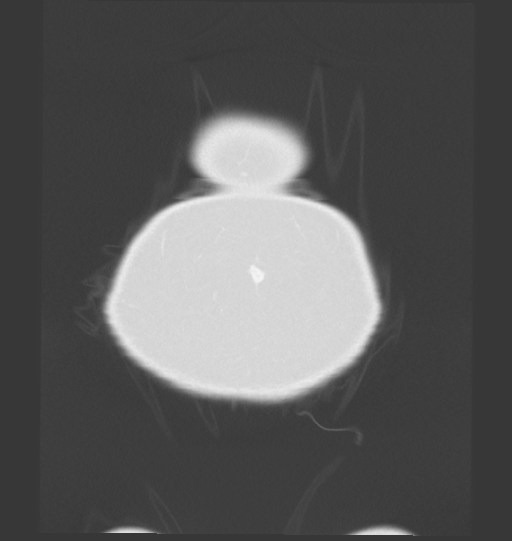
[im 8/117  bone]
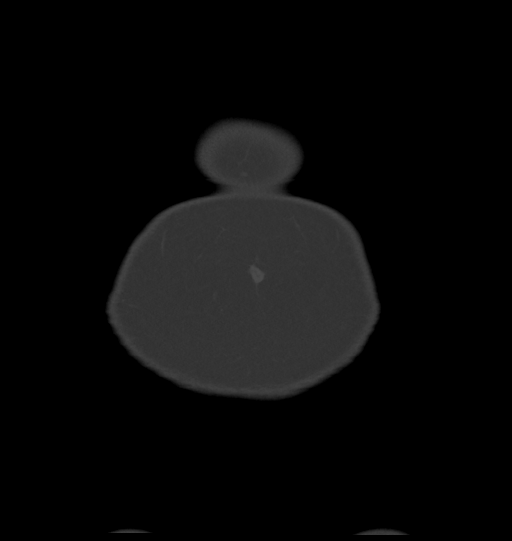
[im 12/117  lung]
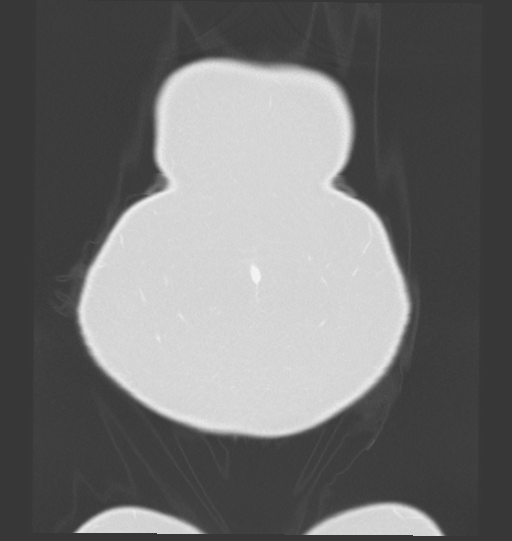
[im 15/117  soft-tissue]
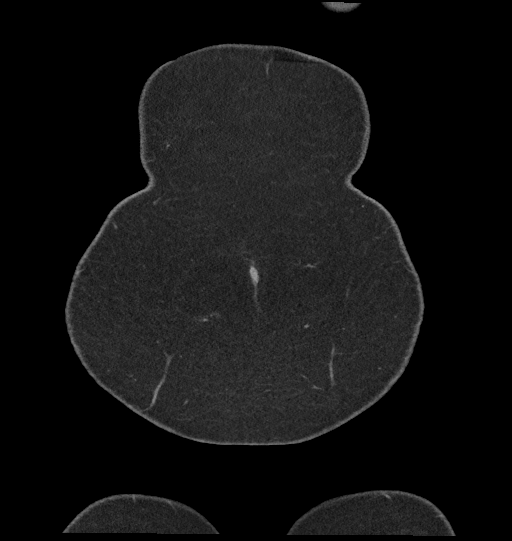
[im 15/117  lung]
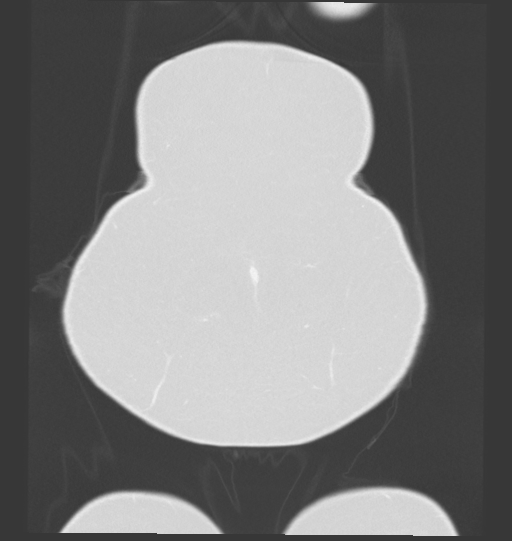
[im 26/117  soft-tissue]
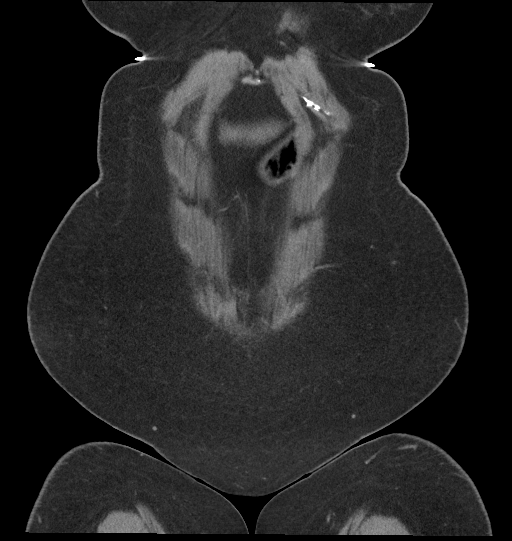
[im 38/117  soft-tissue]
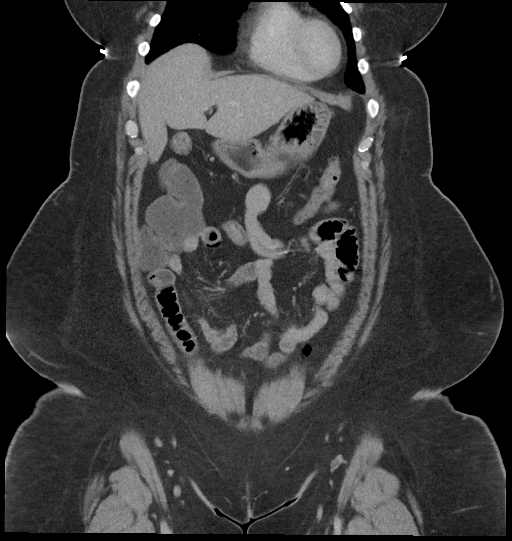
[im 45/117  soft-tissue]
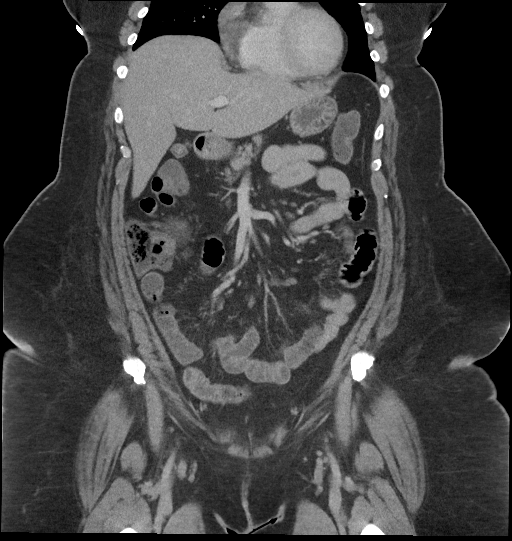
[im 53/117  soft-tissue]
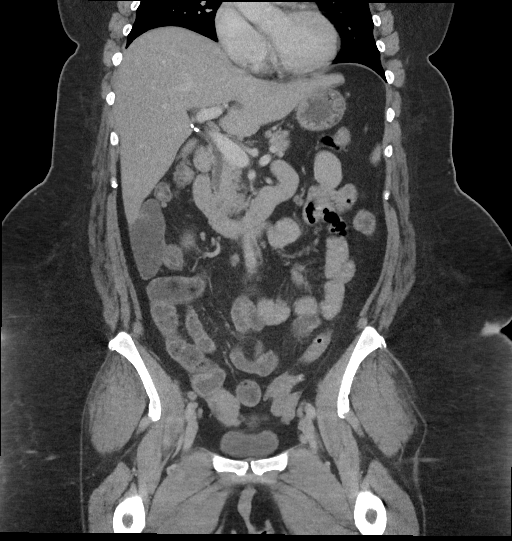
[im 64/117  soft-tissue]
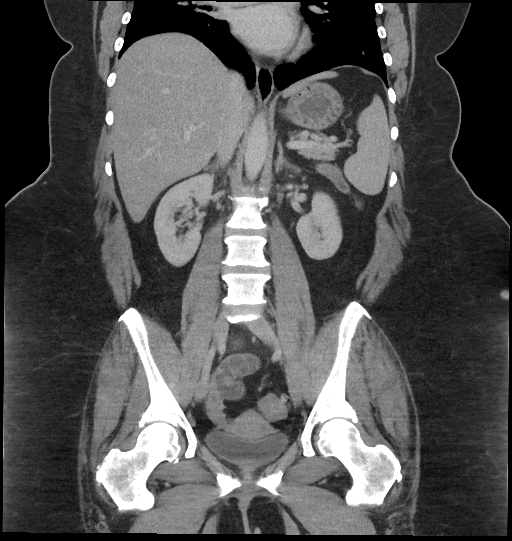
[im 72/117  soft-tissue]
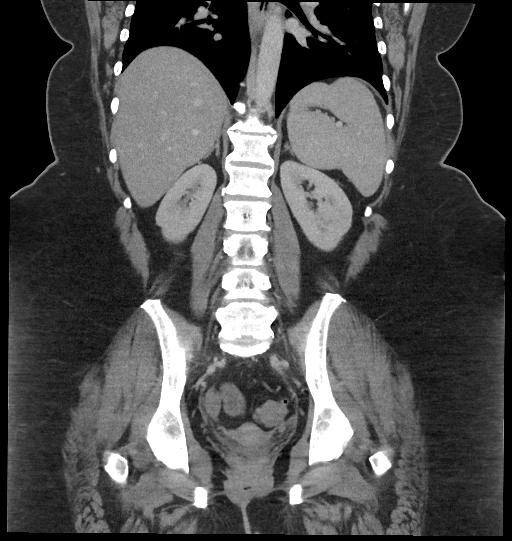
[im 79/117  soft-tissue]
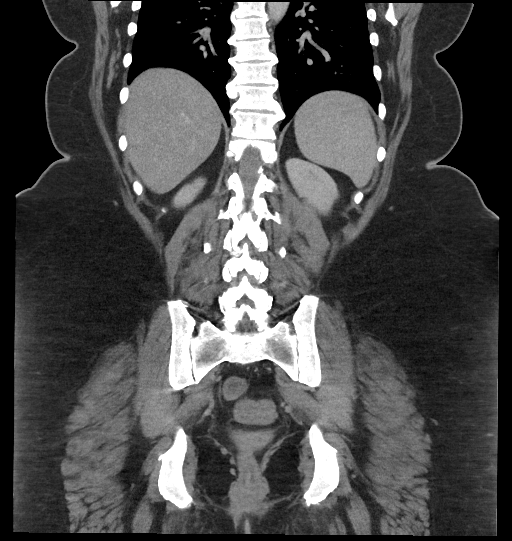
[im 79/117  bone]
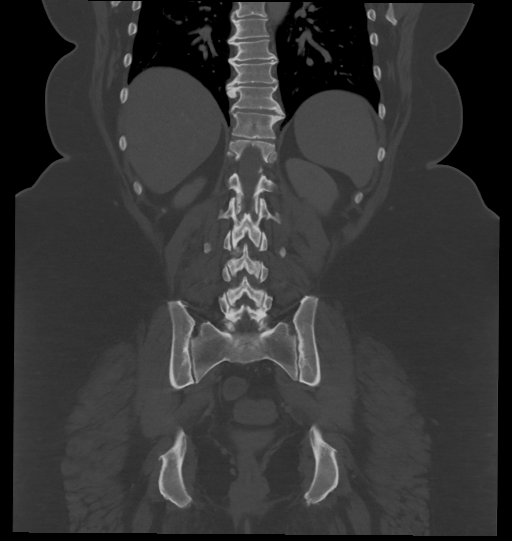
[im 91/117  soft-tissue]
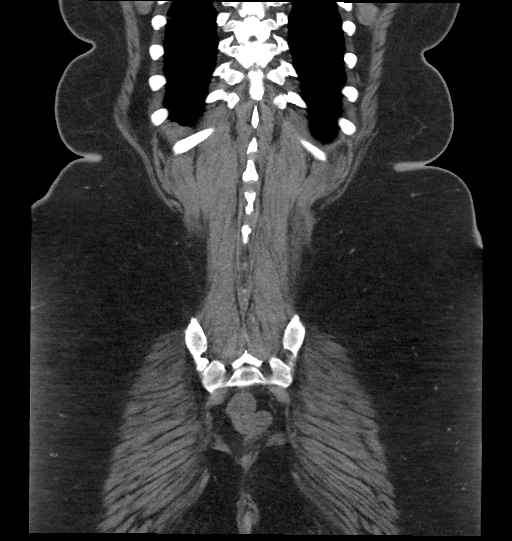
[im 102/117  soft-tissue]
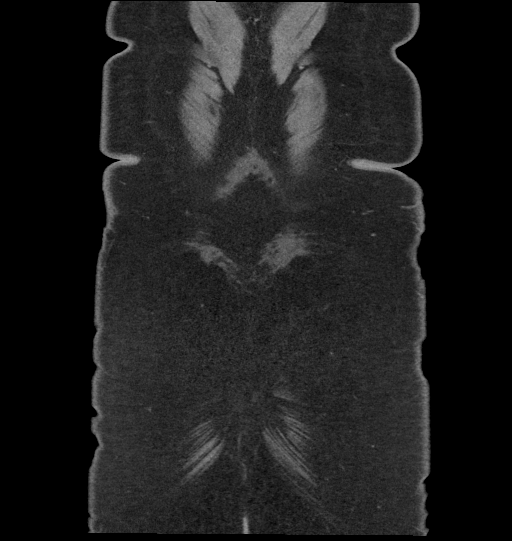
[im 109/117  soft-tissue]
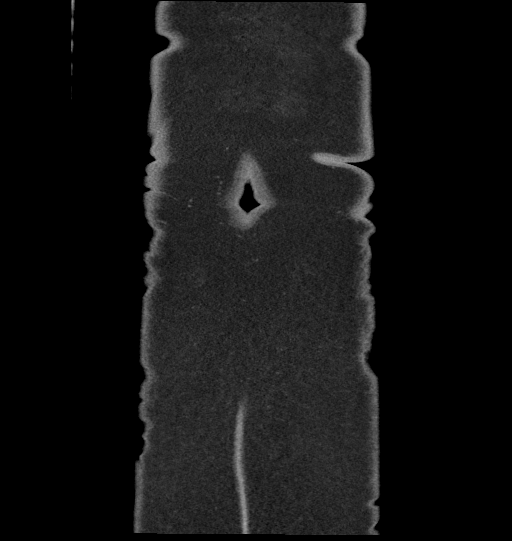

[14 of 40 positions shown; findings below may reference images not displayed]

FINDINGS: Lower chest: No acute abnormality.

Hepatobiliary: No focal liver abnormality is seen. Status post
cholecystectomy. No biliary dilatation.

Pancreas: Unremarkable. No pancreatic ductal dilatation or
surrounding inflammatory changes.

Spleen: Normal in size without focal abnormality.

Adrenals/Urinary Tract: Adrenal glands are unremarkable. Kidneys are
normal, without renal calculi, focal lesion, or hydronephrosis.
Bladder is unremarkable.

Stomach/Bowel: Stomach is within normal limits. Appendix appears
normal. No evidence of bowel wall thickening, distention, or
inflammatory changes. There are several fluid-filled loops of small
bowel. Multiple colonic diverticula without evidence of
diverticulitis.

Vascular/Lymphatic: Minimal aortic atherosclerosis. No enlarged
abdominal or pelvic lymph nodes.

Reproductive: Uterus and bilateral adnexa are unremarkable.

Other: No abdominal wall hernia or abnormality. No abdominopelvic
ascites.

Musculoskeletal: No acute or significant osseous findings.
IMPRESSION: 1. There are several fluid-filled loops of small bowel which may
represent enteritis. No evidence of bowel obstruction.
2. Colonic diverticulosis without evidence of diverticulitis.
3. Aortic atherosclerosis.

Aortic Atherosclerosis ([id number]).

## 2021-06-16 MED ORDER — SODIUM CHLORIDE 0.9 % IV BOLUS
1000.0000 mL | Freq: Once | INTRAVENOUS | Status: AC
  Administered 2021-06-16: 1000 mL via INTRAVENOUS

## 2021-06-16 MED ORDER — IOHEXOL 300 MG/ML  SOLN
100.0000 mL | Freq: Once | INTRAMUSCULAR | Status: AC | PRN
  Administered 2021-06-16: 100 mL via INTRAVENOUS

## 2021-06-16 MED ORDER — ONDANSETRON 4 MG PO TBDP
4.0000 mg | ORAL_TABLET | Freq: Once | ORAL | Status: AC | PRN
  Administered 2021-06-16: 4 mg via ORAL
  Filled 2021-06-16: qty 1

## 2021-06-16 MED ORDER — MORPHINE SULFATE (PF) 4 MG/ML IV SOLN
4.0000 mg | INTRAVENOUS | Status: DC | PRN
  Administered 2021-06-16: 4 mg via INTRAVENOUS
  Filled 2021-06-16: qty 1

## 2021-06-16 MED ORDER — ONDANSETRON 4 MG PO TBDP: 4.0000 mg | ORAL_TABLET | Freq: Three times a day (TID) | ORAL | 0 refills | Status: AC | PRN

## 2021-06-16 NOTE — ED Notes (Signed)
Rn to bedside to introduce self to pt. Pt caoX4 and in no acute distress. Husband at bedside.

## 2021-06-16 NOTE — ED Provider Notes (Signed)
[facility] Emergency Department Provider Note    Event Date/Time   First MD Initiated Contact with Patient 06/16/21 [phone]     (approximate)  I have reviewed the triage vital signs and the nursing notes.   HISTORY  Chief Complaint Abdominal Pain    HPI Billiejean ARIANNI GOSSMAN is a 50 y.o. female with a history of diverticulitis as well as cholecystitis status postcholecystectomy presents to the ER for evaluation of generalized epigastric periumbilical pain rating to left lower quadrant.  Is having nonbloody watery diarrhea for the past 12-24 hrs.  Does not recall eating anything that may have precipitated this.  States that the pain is moderate to severe.  Has not had any flank pain.  No dysuria.  No recent antibiotics.  Past Medical History:  Diagnosis Date   Diverticulitis    Family History  Problem Relation Age of Onset   Breast cancer Maternal Aunt 50   Past Surgical History:  Procedure Laterality Date   CESAREAN SECTION     CHOLECYSTECTOMY     There are no problems to display for this patient.     Prior to Admission medications   Medication Sig Start Date End Date Taking? Authorizing Provider  ondansetron (ZOFRAN ODT) 4 MG disintegrating tablet Take 1 tablet (4 mg total) by mouth every 8 (eight) hours as needed for nausea or vomiting. 06/16/21  Yes Merlyn Lot, MD  cyclobenzaprine (FLEXERIL) 10 MG tablet Take 1 tablet (10 mg total) by mouth every 8 (eight) hours as needed for muscle spasms. 12/20/15   Beers, Pierce Crane, PA-C  HYDROcodone-acetaminophen (NORCO) 5-325 MG tablet Take 1-2 tablets by mouth every 4 (four) hours as needed for moderate pain. 12/20/15   Beers, Pierce Crane, PA-C  ibuprofen (ADVIL,MOTRIN) 800 MG tablet Take 1 tablet (800 mg total) by mouth every 8 (eight) hours as needed. 12/20/15   Beers, Pierce Crane, PA-C    Allergies Methocarbamol and Prednisone    Social History Social History   Tobacco Use   Smoking status: Every  Day    Packs/day: 1.00    Types: Cigarettes  Substance Use Topics   Alcohol use: No    Comment: occasional    Review of Systems Patient denies headaches, rhinorrhea, blurry vision, numbness, shortness of breath, chest pain, edema, cough, abdominal pain, nausea, vomiting, diarrhea, dysuria, fevers, rashes or hallucinations unless otherwise stated above in HPI. ____________________________________________   PHYSICAL EXAM:  VITAL SIGNS: Vitals:   06/16/21 0827 06/16/21 1115  BP: (!) 146/102 (!) 148/82  Pulse: 97 80  Resp: 17 16  Temp: 97.7 F (36.5 C)   SpO2: 97% 97%    Constitutional: Alert and oriented.  Eyes: Conjunctivae are normal.  Head: Atraumatic. Nose: No congestion/rhinnorhea. Mouth/Throat: Mucous membranes are moist.   Neck: No stridor. Painless ROM.  Cardiovascular: Normal rate, regular rhythm. Grossly normal heart sounds.  Good peripheral circulation. Respiratory: Normal respiratory effort.  No retractions. Lungs CTAB. Gastrointestinal: Soft mild ttp in llq, no guarding or rebound. No distention. No abdominal bruits. No CVA tenderness. Genitourinary:  Musculoskeletal: No lower extremity tenderness nor edema.  No joint effusions. Neurologic:  Normal speech and language. No gross focal neurologic deficits are appreciated. No facial droop Skin:  Skin is warm, dry and intact. No rash noted. Psychiatric: Mood and affect are normal. Speech and behavior are normal.  ____________________________________________   LABS (all labs ordered are listed, but only abnormal results are displayed)  Results for orders placed or performed during the hospital  encounter of 06/16/21 (from the past 24 hour(s))  Lipase, blood     Status: None   Collection Time: 06/16/21  8:35 AM  Result Value Ref Range   Lipase 50 11 - 51 U/L  Comprehensive metabolic panel     Status: Abnormal   Collection Time: 06/16/21  8:35 AM  Result Value Ref Range   Sodium 135 135 - 145 mmol/L    Potassium 4.3 3.5 - 5.1 mmol/L   Chloride 107 98 - 111 mmol/L   CO2 21 (L) 22 - 32 mmol/L   Glucose, Bld 125 (H) 70 - 99 mg/dL   BUN 17 6 - 20 mg/dL   Creatinine, Ser 1.610.84 0.44 - 1.00 mg/dL   Calcium 9.1 8.9 - 09.610.3 mg/dL   Total Protein 7.8 6.5 - 8.1 g/dL   Albumin 3.8 3.5 - 5.0 g/dL   AST 17 15 - 41 U/L   ALT 15 0 - 44 U/L   Alkaline Phosphatase 69 38 - 126 U/L   Total Bilirubin 0.6 0.3 - 1.2 mg/dL   GFR, Estimated >04>60 >54>60 mL/min   Anion gap 7 5 - 15  CBC     Status: Abnormal   Collection Time: 06/16/21  8:35 AM  Result Value Ref Range   WBC 16.2 (H) 4.0 - 10.5 K/uL   RBC 5.36 (H) 3.87 - 5.11 MIL/uL   Hemoglobin 15.9 (H) 12.0 - 15.0 g/dL   HCT 09.847.5 (H) 11.936.0 - 14.746.0 %   MCV 88.6 80.0 - 100.0 fL   MCH 29.7 26.0 - 34.0 pg   MCHC 33.5 30.0 - 36.0 g/dL   RDW 82.913.0 56.211.5 - 13.015.5 %   Platelets 296 150 - 400 K/uL   nRBC 0.0 0.0 - 0.2 %  Urinalysis, Routine w reflex microscopic     Status: Abnormal   Collection Time: 06/16/21  8:35 AM  Result Value Ref Range   Color, Urine YELLOW (A) YELLOW   APPearance CLEAR (A) CLEAR   Specific Gravity, Urine >1.046 (H) 1.005 - 1.030   pH 5.0 5.0 - 8.0   Glucose, UA NEGATIVE NEGATIVE mg/dL   Hgb urine dipstick SMALL (A) NEGATIVE   Bilirubin Urine NEGATIVE NEGATIVE   Ketones, ur NEGATIVE NEGATIVE mg/dL   Protein, ur NEGATIVE NEGATIVE mg/dL   Nitrite NEGATIVE NEGATIVE   Leukocytes,Ua NEGATIVE NEGATIVE   RBC / HPF 0-5 0 - 5 RBC/hpf   WBC, UA 0-5 0 - 5 WBC/hpf   Bacteria, UA RARE (A) NONE SEEN   Squamous Epithelial / LPF 0-5 0 - 5   Mucus PRESENT   Resp Panel by RT-PCR (Flu A&B, Covid) Nasopharyngeal Swab     Status: None   Collection Time: 06/16/21 10:01 AM   Specimen: Nasopharyngeal Swab; Nasopharyngeal(NP) swabs in vial transport medium  Result Value Ref Range   SARS Coronavirus 2 by RT PCR NEGATIVE NEGATIVE   Influenza A by PCR NEGATIVE NEGATIVE   Influenza B by PCR NEGATIVE NEGATIVE    ____________________________________________ ____________________________________________  RADIOLOGY  I personally reviewed all radiographic images ordered to evaluate for the above acute complaints and reviewed radiology reports and findings.  These findings were personally discussed with the patient.  Please see medical record for radiology report.  ____________________________________________   PROCEDURES  Procedure(s) performed:  Procedures    Critical Care performed: no ____________________________________________   INITIAL IMPRESSION / ASSESSMENT AND PLAN / ED COURSE  Pertinent labs & imaging results that were available during my care of the patient were reviewed by  me and considered in my medical decision making (see chart for details).   DDX: diverticulitis, colitis, enteritis, stone, pyelo, appendicitis, viral illness  Breniyah K Mayfield is a 50 y.o. who presents to the ED with diarrhea and abdominal pain as described above she is afebrile nontoxic-appearing exam somewhat limited due to body habitus does have leukocytosis given her history of diverticulitis and previous surgical history will order CT imaging to evaluate for acute intra abdominal process.  Will provide IV fluids as well as IV antiemetic and IV narcotic pain medication.  Clinical Course as of 06/16/21 1256  Sat Jun 16, 2021  1045 My review of CT imaging does have a small trace amount of diverticulitis but do not see any sign of abscess or perforation I do not appreciate any significant bowel obstruction or other acute abnormality. [PR]  1254 CT imaging reviewed and without evidence of acute intra-abdominal surgical process.  Likely enteritis.  Patient tolerating p.o.  Has received IV fluids.  Feels improved.  We will send off for stool pathologies but have a lower suspicion C. difficile.  Given lack of blood in stool still think is more likely a viral process.  I do believe patient stable and appropriate for  outpatient follow-up at this time.  Discussed strict return precautions. [PR]    Clinical Course User Index [PR] Willy Eddy, MD    The patient was evaluated in Emergency Department today for the symptoms described in the history of present illness. He/she was evaluated in the context of the global COVID-19 pandemic, which necessitated consideration that the patient might be at risk for infection with the SARS-CoV-2 virus that causes COVID-19. Institutional protocols and algorithms that pertain to the evaluation of patients at risk for COVID-19 are in a state of rapid change based on information released by regulatory bodies including the CDC and federal and state organizations. These policies and algorithms were followed during the patient's care in the ED.  As part of my medical decision making, I reviewed the following data within the electronic MEDICAL RECORD NUMBER Nursing notes reviewed and incorporated, Labs reviewed, notes from prior ED visits and [place] Controlled Substance Database   ____________________________________________   FINAL CLINICAL IMPRESSION(S) / ED DIAGNOSES  Final diagnoses:  Diarrhea of presumed infectious origin  Generalized abdominal pain      NEW MEDICATIONS STARTED DURING THIS VISIT:  New Prescriptions   ONDANSETRON (ZOFRAN ODT) 4 MG DISINTEGRATING TABLET    Take 1 tablet (4 mg total) by mouth every 8 (eight) hours as needed for nausea or vomiting.     Note:  This document was prepared using Dragon voice recognition software and may include unintentional dictation errors.    Willy Eddy, MD 06/16/21 1256

## 2021-06-16 NOTE — ED Triage Notes (Signed)
Patient c/o lower abdominal pain with radiation to epigastric with accompanying symptoms of N/V/D - diarrhea described as watery.

## 2021-06-16 NOTE — ED Notes (Signed)
Pt given water for PO challenge. Pt advised she feels much better.

## 2021-06-16 NOTE — ED Notes (Signed)
First Nurse Note:  Patient coming [facility] from home for onset of severe left abdominal pain and flank pain. Patient also c/o N/V/D. Patient with PMH of diverticulitis and cholecystectomy. Patient hypertensive in the 160's systolic, all other vitals WNL.

## 2021-06-16 NOTE — Discharge Instructions (Signed)

## 2021-06-18 ENCOUNTER — Other Ambulatory Visit: Payer: Self-pay | Admitting: Physician Assistant

## 2021-06-18 DIAGNOSIS — R109 Unspecified abdominal pain: Secondary | ICD-10-CM

## 2021-06-20 ENCOUNTER — Ambulatory Visit: Admission: RE | Admit: 2021-06-20 | Payer: BC Managed Care – PPO | Source: Ambulatory Visit

## 2021-08-21 DIAGNOSIS — Z6841 Body Mass Index (BMI) 40.0 and over, adult: Secondary | ICD-10-CM | POA: Diagnosis not present

## 2021-09-12 DIAGNOSIS — R059 Cough, unspecified: Secondary | ICD-10-CM | POA: Diagnosis not present

## 2021-09-12 DIAGNOSIS — R112 Nausea with vomiting, unspecified: Secondary | ICD-10-CM | POA: Diagnosis not present

## 2021-09-12 DIAGNOSIS — J111 Influenza due to unidentified influenza virus with other respiratory manifestations: Secondary | ICD-10-CM | POA: Diagnosis not present

## 2021-09-21 DIAGNOSIS — Z713 Dietary counseling and surveillance: Secondary | ICD-10-CM | POA: Diagnosis not present

## 2021-09-21 DIAGNOSIS — Z6841 Body Mass Index (BMI) 40.0 and over, adult: Secondary | ICD-10-CM | POA: Diagnosis not present

## 2021-10-19 DIAGNOSIS — Z713 Dietary counseling and surveillance: Secondary | ICD-10-CM | POA: Diagnosis not present

## 2021-11-20 DIAGNOSIS — Z6841 Body Mass Index (BMI) 40.0 and over, adult: Secondary | ICD-10-CM | POA: Diagnosis not present

## 2021-11-20 DIAGNOSIS — Z713 Dietary counseling and surveillance: Secondary | ICD-10-CM | POA: Diagnosis not present

## 2021-12-07 DIAGNOSIS — K921 Melena: Secondary | ICD-10-CM | POA: Diagnosis not present

## 2021-12-07 DIAGNOSIS — J101 Influenza due to other identified influenza virus with other respiratory manifestations: Secondary | ICD-10-CM | POA: Diagnosis not present

## 2021-12-21 DIAGNOSIS — Z713 Dietary counseling and surveillance: Secondary | ICD-10-CM | POA: Diagnosis not present

## 2021-12-21 DIAGNOSIS — Z1211 Encounter for screening for malignant neoplasm of colon: Secondary | ICD-10-CM | POA: Diagnosis not present

## 2022-01-18 DIAGNOSIS — Z20822 Contact with and (suspected) exposure to covid-19: Secondary | ICD-10-CM | POA: Diagnosis not present

## 2022-02-04 DIAGNOSIS — H6990 Unspecified Eustachian tube disorder, unspecified ear: Secondary | ICD-10-CM | POA: Diagnosis not present

## 2022-02-21 DIAGNOSIS — U071 COVID-19: Secondary | ICD-10-CM | POA: Diagnosis not present

## 2022-02-21 DIAGNOSIS — Z713 Dietary counseling and surveillance: Secondary | ICD-10-CM | POA: Diagnosis not present

## 2022-02-21 DIAGNOSIS — J101 Influenza due to other identified influenza virus with other respiratory manifestations: Secondary | ICD-10-CM | POA: Diagnosis not present

## 2022-02-21 DIAGNOSIS — B351 Tinea unguium: Secondary | ICD-10-CM | POA: Diagnosis not present

## 2022-02-28 DIAGNOSIS — R232 Flushing: Secondary | ICD-10-CM | POA: Diagnosis not present

## 2022-02-28 DIAGNOSIS — Z13220 Encounter for screening for lipoid disorders: Secondary | ICD-10-CM | POA: Diagnosis not present

## 2022-02-28 DIAGNOSIS — B351 Tinea unguium: Secondary | ICD-10-CM | POA: Diagnosis not present

## 2022-04-05 DIAGNOSIS — K5900 Constipation, unspecified: Secondary | ICD-10-CM | POA: Diagnosis not present

## 2022-04-05 DIAGNOSIS — Z713 Dietary counseling and surveillance: Secondary | ICD-10-CM | POA: Diagnosis not present

## 2022-04-05 DIAGNOSIS — Z6841 Body Mass Index (BMI) 40.0 and over, adult: Secondary | ICD-10-CM | POA: Diagnosis not present

## 2022-06-06 DIAGNOSIS — M62838 Other muscle spasm: Secondary | ICD-10-CM | POA: Diagnosis not present

## 2022-06-06 DIAGNOSIS — Z6841 Body Mass Index (BMI) 40.0 and over, adult: Secondary | ICD-10-CM | POA: Diagnosis not present

## 2022-06-06 DIAGNOSIS — Z713 Dietary counseling and surveillance: Secondary | ICD-10-CM | POA: Diagnosis not present

## 2022-06-06 DIAGNOSIS — Z1331 Encounter for screening for depression: Secondary | ICD-10-CM | POA: Diagnosis not present

## 2022-06-28 DIAGNOSIS — R6889 Other general symptoms and signs: Secondary | ICD-10-CM | POA: Diagnosis not present

## 2022-06-28 DIAGNOSIS — J111 Influenza due to unidentified influenza virus with other respiratory manifestations: Secondary | ICD-10-CM | POA: Diagnosis not present

## 2022-07-03 DIAGNOSIS — J329 Chronic sinusitis, unspecified: Secondary | ICD-10-CM | POA: Diagnosis not present

## 2022-07-03 DIAGNOSIS — R6889 Other general symptoms and signs: Secondary | ICD-10-CM | POA: Diagnosis not present

## 2022-08-08 DIAGNOSIS — A630 Anogenital (venereal) warts: Secondary | ICD-10-CM | POA: Diagnosis not present

## 2022-08-08 DIAGNOSIS — N9089 Other specified noninflammatory disorders of vulva and perineum: Secondary | ICD-10-CM | POA: Diagnosis not present

## 2022-08-20 DIAGNOSIS — Z6841 Body Mass Index (BMI) 40.0 and over, adult: Secondary | ICD-10-CM | POA: Diagnosis not present

## 2022-08-20 DIAGNOSIS — Z720 Tobacco use: Secondary | ICD-10-CM | POA: Diagnosis not present

## 2022-08-20 DIAGNOSIS — Z713 Dietary counseling and surveillance: Secondary | ICD-10-CM | POA: Diagnosis not present

## 2022-08-21 DIAGNOSIS — J101 Influenza due to other identified influenza virus with other respiratory manifestations: Secondary | ICD-10-CM | POA: Diagnosis not present

## 2022-09-04 DIAGNOSIS — M75121 Complete rotator cuff tear or rupture of right shoulder, not specified as traumatic: Secondary | ICD-10-CM | POA: Diagnosis not present

## 2022-09-04 DIAGNOSIS — M7521 Bicipital tendinitis, right shoulder: Secondary | ICD-10-CM | POA: Diagnosis not present

## 2022-09-04 DIAGNOSIS — M7581 Other shoulder lesions, right shoulder: Secondary | ICD-10-CM | POA: Diagnosis not present

## 2022-09-04 DIAGNOSIS — M7531 Calcific tendinitis of right shoulder: Secondary | ICD-10-CM | POA: Diagnosis not present

## 2022-09-19 DIAGNOSIS — Z1231 Encounter for screening mammogram for malignant neoplasm of breast: Secondary | ICD-10-CM | POA: Diagnosis not present

## 2022-09-19 DIAGNOSIS — Z01419 Encounter for gynecological examination (general) (routine) without abnormal findings: Secondary | ICD-10-CM | POA: Diagnosis not present

## 2022-09-19 DIAGNOSIS — Z1211 Encounter for screening for malignant neoplasm of colon: Secondary | ICD-10-CM | POA: Diagnosis not present

## 2022-09-20 DIAGNOSIS — R6889 Other general symptoms and signs: Secondary | ICD-10-CM | POA: Diagnosis not present

## 2022-10-09 DIAGNOSIS — M7521 Bicipital tendinitis, right shoulder: Secondary | ICD-10-CM | POA: Diagnosis not present

## 2022-10-09 DIAGNOSIS — M7531 Calcific tendinitis of right shoulder: Secondary | ICD-10-CM | POA: Diagnosis not present

## 2022-10-09 DIAGNOSIS — M7581 Other shoulder lesions, right shoulder: Secondary | ICD-10-CM | POA: Diagnosis not present

## 2022-11-18 DIAGNOSIS — E559 Vitamin D deficiency, unspecified: Secondary | ICD-10-CM | POA: Diagnosis not present

## 2022-11-18 DIAGNOSIS — Z6841 Body Mass Index (BMI) 40.0 and over, adult: Secondary | ICD-10-CM | POA: Diagnosis not present

## 2022-11-18 DIAGNOSIS — D72829 Elevated white blood cell count, unspecified: Secondary | ICD-10-CM | POA: Diagnosis not present

## 2022-12-30 DIAGNOSIS — M62830 Muscle spasm of back: Secondary | ICD-10-CM | POA: Diagnosis not present

## 2022-12-30 DIAGNOSIS — R399 Unspecified symptoms and signs involving the genitourinary system: Secondary | ICD-10-CM | POA: Diagnosis not present

## 2022-12-30 DIAGNOSIS — R1010 Upper abdominal pain, unspecified: Secondary | ICD-10-CM | POA: Diagnosis not present

## 2022-12-30 DIAGNOSIS — R748 Abnormal levels of other serum enzymes: Secondary | ICD-10-CM | POA: Diagnosis not present

## 2023-01-04 DIAGNOSIS — R109 Unspecified abdominal pain: Secondary | ICD-10-CM | POA: Diagnosis not present

## 2023-01-04 DIAGNOSIS — R748 Abnormal levels of other serum enzymes: Secondary | ICD-10-CM | POA: Diagnosis not present

## 2023-01-29 DIAGNOSIS — K219 Gastro-esophageal reflux disease without esophagitis: Secondary | ICD-10-CM | POA: Diagnosis not present

## 2023-01-29 DIAGNOSIS — Z6841 Body Mass Index (BMI) 40.0 and over, adult: Secondary | ICD-10-CM | POA: Diagnosis not present

## 2023-04-18 ENCOUNTER — Ambulatory Visit: Payer: BC Managed Care – PPO | Admitting: Certified Registered"

## 2023-04-18 ENCOUNTER — Encounter
Admission: RE | Disposition: A | Discharge status: Home / Self Care | Payer: Self-pay | Source: Home / Self Care | Attending: Gastroenterology

## 2023-04-18 ENCOUNTER — Other Ambulatory Visit: Payer: Self-pay

## 2023-04-18 ENCOUNTER — Encounter: Payer: Self-pay | Admitting: *Deleted

## 2023-04-18 ENCOUNTER — Ambulatory Visit
Admission: RE | Admit: 2023-04-18 | Discharge: 2023-04-18 | Disposition: A | Discharge status: Home / Self Care | Payer: BC Managed Care – PPO | Attending: Gastroenterology | Admitting: Gastroenterology

## 2023-04-18 DIAGNOSIS — K635 Polyp of colon: Secondary | ICD-10-CM | POA: Insufficient documentation

## 2023-04-18 DIAGNOSIS — K5730 Diverticulosis of large intestine without perforation or abscess without bleeding: Secondary | ICD-10-CM | POA: Insufficient documentation

## 2023-04-18 DIAGNOSIS — K640 First degree hemorrhoids: Secondary | ICD-10-CM | POA: Insufficient documentation

## 2023-04-18 DIAGNOSIS — Z1211 Encounter for screening for malignant neoplasm of colon: Secondary | ICD-10-CM | POA: Insufficient documentation

## 2023-04-18 DIAGNOSIS — Z83719 Family history of colon polyps, unspecified: Secondary | ICD-10-CM | POA: Insufficient documentation

## 2023-04-18 DIAGNOSIS — Z9049 Acquired absence of other specified parts of digestive tract: Secondary | ICD-10-CM | POA: Diagnosis not present

## 2023-04-18 DIAGNOSIS — Z6841 Body Mass Index (BMI) 40.0 and over, adult: Secondary | ICD-10-CM | POA: Insufficient documentation

## 2023-04-18 DIAGNOSIS — E669 Obesity, unspecified: Secondary | ICD-10-CM | POA: Diagnosis not present

## 2023-04-18 HISTORY — PX: COLONOSCOPY WITH PROPOFOL: SHX5780

## 2023-04-18 HISTORY — PX: POLYPECTOMY: SHX5525

## 2023-04-18 SURGERY — COLONOSCOPY WITH PROPOFOL
Anesthesia: General

## 2023-04-18 MED ORDER — SODIUM CHLORIDE 0.9 % IV SOLN: INTRAVENOUS | Status: DC

## 2023-04-18 MED ORDER — PROPOFOL 10 MG/ML IV BOLUS
INTRAVENOUS | Status: DC | PRN
Start: 2023-04-18 — End: 2023-04-18
  Administered 2023-04-18: 150 ug/kg/min via INTRAVENOUS
  Administered 2023-04-18: 50 mg via INTRAVENOUS

## 2023-04-18 MED ORDER — LIDOCAINE HCL (CARDIAC) PF 100 MG/5ML IV SOSY
PREFILLED_SYRINGE | INTRAVENOUS | Status: DC | PRN
Start: 2023-04-18 — End: 2023-04-18
  Administered 2023-04-18: 100 mg via INTRAVENOUS

## 2023-04-18 NOTE — Anesthesia Postprocedure Evaluation (Signed)
Anesthesia Post Note  Patient: Miranda Daniels  Procedure(s) Performed: COLONOSCOPY WITH PROPOFOL POLYPECTOMY  Patient location during evaluation: Endoscopy Anesthesia Type: General Level of consciousness: awake and alert Pain management: pain level controlled Vital Signs Assessment: post-procedure vital signs reviewed and stable Respiratory status: spontaneous breathing, nonlabored ventilation, respiratory function stable and patient connected to nasal cannula oxygen Cardiovascular status: blood pressure returned to baseline and stable Postop Assessment: no apparent nausea or vomiting Anesthetic complications: no   There were no known notable events for this encounter.   Last Vitals:  Vitals:   04/18/23 0832 04/18/23 1004  BP: 133/77 116/69  Pulse: 71 71  Resp: 16 (!) 24  Temp: (!) 36.3 C   SpO2: 100% 97%    Last Pain:  Vitals:   04/18/23 1004  TempSrc:   PainSc: Asleep                 Louie Boston

## 2023-04-18 NOTE — H&P (Signed)
Outpatient short stay form Pre-procedure 04/18/2023  Regis Bill, MD  Primary Physician: Ellis Parents, FNP  Reason for visit:  Screening  History of present illness:    52 y/o lady with history of obesity here for index screening colonoscopy. No blood thinners. History of cholecystectomy. No family history of GI malignancies.    Current Facility-Administered Medications:    0.9 %  sodium chloride infusion, , Intravenous, Continuous, Denali Sharma, Rossie Muskrat, MD, Last Rate: 20 mL/hr at 04/18/23 0845, New Bag at 04/18/23 0845  Medications Prior to Admission  Medication Sig Dispense Refill Last Dose   lisdexamfetamine (VYVANSE) 50 MG capsule Take 50 mg by mouth daily.   04/17/2023   cyclobenzaprine (FLEXERIL) 10 MG tablet Take 1 tablet (10 mg total) by mouth every 8 (eight) hours as needed for muscle spasms. 30 tablet 1    HYDROcodone-acetaminophen (NORCO) 5-325 MG tablet Take 1-2 tablets by mouth every 4 (four) hours as needed for moderate pain. 10 tablet 0    ibuprofen (ADVIL,MOTRIN) 800 MG tablet Take 1 tablet (800 mg total) by mouth every 8 (eight) hours as needed. 30 tablet 0    ondansetron (ZOFRAN ODT) 4 MG disintegrating tablet Take 1 tablet (4 mg total) by mouth every 8 (eight) hours as needed for nausea or vomiting. 20 tablet 0      Allergies  Allergen Reactions   Methocarbamol Other (See Comments)    Bad headaches   Prednisone     Yeast infection     Past Medical History:  Diagnosis Date   Diverticulitis     Review of systems:  Otherwise negative.    Physical Exam  Gen: Alert, oriented. Appears stated age.  HEENT: PERRLA. Lungs: No respiratory distress CV: RRR Abd: soft, benign, no masses Ext: No edema    Planned procedures: Proceed with colonoscopy. The patient understands the nature of the planned procedure, indications, risks, alternatives and potential complications including but not limited to bleeding, infection, perforation, damage to  internal organs and possible oversedation/side effects from anesthesia. The patient agrees and gives consent to proceed.  Please refer to procedure notes for findings, recommendations and patient disposition/instructions.     Regis Bill, MD [facility]

## 2023-04-18 NOTE — Op Note (Signed)
[facility] Gastroenterology Patient Name: Miranda Daniels Procedure Date: 04/18/2023 9:28 AM MRN: [id number] Account #: [id number] Date of Birth: 1970-09-29 Admit Type: Outpatient Age: 52 Room: [facility] ENDO ROOM 3 Gender: Female Note Status: Finalized Instrument Name: Nelda Marseille [id number] Procedure:             Colonoscopy Indications:           Colon cancer screening in patient at increased risk:                         Family history of 1st-degree relative with colon                         polyps before age 72 years Providers:             Eather Colas MD, MD Medicines:             Monitored Anesthesia Care Complications:         No immediate complications. Estimated blood loss:                         Minimal. Procedure:             Pre-Anesthesia Assessment:                        - Prior to the procedure, a History and Physical was                         performed, and patient medications and allergies were                         reviewed. The patient is competent. The risks and                         benefits of the procedure and the sedation options and                         risks were discussed with the patient. All questions                         were answered and informed consent was obtained.                         Patient identification and proposed procedure were                         verified by the physician, the nurse, the                         anesthesiologist, the anesthetist and the technician                         in the endoscopy suite. Mental Status Examination:                         alert and oriented. Airway Examination: normal                         oropharyngeal airway and neck mobility. Respiratory  Examination: clear to auscultation. CV Examination:                         normal. Prophylactic Antibiotics: The patient does not                         require prophylactic antibiotics. Prior                          Anticoagulants: The patient has taken no anticoagulant                         or antiplatelet agents. ASA Grade Assessment: III - A                         patient with severe systemic disease. After reviewing                         the risks and benefits, the patient was deemed in                         satisfactory condition to undergo the procedure. The                         anesthesia plan was to use monitored anesthesia care                         (MAC). Immediately prior to administration of                         medications, the patient was re-assessed for adequacy                         to receive sedatives. The heart rate, respiratory                         rate, oxygen saturations, blood pressure, adequacy of                         pulmonary ventilation, and response to care were                         monitored throughout the procedure. The physical                         status of the patient was re-assessed after the                         procedure.                        After obtaining informed consent, the colonoscope was                         passed under direct vision. Throughout the procedure,                         the patient's blood pressure, pulse, and oxygen  saturations were monitored continuously. The                         Colonoscope was introduced through the anus and                         advanced to the the terminal ileum. The colonoscopy                         was performed without difficulty. The patient                         tolerated the procedure well. The quality of the bowel                         preparation was good. The ileocecal valve, appendiceal                         orifice, and rectum were photographed. Findings:      The perianal and digital rectal examinations were normal.      The terminal ileum appeared normal.      A 3 mm polyp was found in the sigmoid colon. The polyp was  sessile. The       polyp was removed with a cold snare. Resection and retrieval were       complete. Estimated blood loss was minimal.      Multiple small-mouthed diverticula were found in the sigmoid colon.      Internal hemorrhoids were found during retroflexion. The hemorrhoids       were Grade I (internal hemorrhoids that do not prolapse).      The exam was otherwise without abnormality on direct and retroflexion       views. Impression:            - The examined portion of the ileum was normal.                        - One 3 mm polyp in the sigmoid colon, removed with a                         cold snare. Resected and retrieved.                        - Diverticulosis in the sigmoid colon.                        - Internal hemorrhoids.                        - The examination was otherwise normal on direct and                         retroflexion views. Recommendation:        - Discharge patient to home.                        - Resume previous diet.                        - Continue present medications.                        -  Await pathology results.                        - Repeat colonoscopy in 7 years for surveillance.                        - Return to referring physician as previously                         scheduled. Procedure Code(s):     --- Professional ---                        [phone], Colonoscopy, flexible; with removal of                         tumor(s), polyp(s), or other lesion(s) by snare                         technique Diagnosis Code(s):     --- Professional ---                        Z83.71, Family history of colonic polyps                        K64.0, First degree hemorrhoids                        D12.5, Benign neoplasm of sigmoid colon                        K57.30, Diverticulosis of large intestine without                         perforation or abscess without bleeding CPT copyright 2022 [employer]. All rights reserved. The codes  documented in this report are preliminary and upon coder review may  be revised to meet current compliance requirements. Eather Colas MD, MD 04/18/2023 10:03:52 AM Number of Addenda: 0 Note Initiated On: 04/18/2023 9:28 AM Scope Withdrawal Time: 0 hours 10 minutes 48 seconds  Total Procedure Duration: 0 hours 14 minutes 28 seconds  Estimated Blood Loss:  Estimated blood loss was minimal.      [facility]

## 2023-04-18 NOTE — Anesthesia Preprocedure Evaluation (Signed)
Anesthesia Evaluation  Patient identified by MRN, date of birth, ID band Patient awake    Reviewed: Allergy & Precautions, NPO status , Patient's Chart, lab work & pertinent test results  History of Anesthesia Complications Negative for: history of anesthetic complications  Airway Mallampati: III  TM Distance: >3 FB Neck ROM: full    Dental no notable dental hx.    Pulmonary neg pulmonary ROS, Current Smoker   Pulmonary exam normal        Cardiovascular negative cardio ROS Normal cardiovascular exam     Neuro/Psych negative neurological ROS  negative psych ROS   GI/Hepatic negative GI ROS, Neg liver ROS,,,  Endo/Other    Morbid obesity (BMI 47)  Renal/GU negative Renal ROS  negative genitourinary   Musculoskeletal   Abdominal   Peds  Hematology negative hematology ROS (+)   Anesthesia Other Findings Past Medical History: No date: Diverticulitis  Past Surgical History: No date: CESAREAN SECTION No date: CHOLECYSTECTOMY     Reproductive/Obstetrics negative OB ROS                             Anesthesia Physical Anesthesia Plan  ASA: 3  Anesthesia Plan: General   Post-op Pain Management: Minimal or no pain anticipated   Induction: Intravenous  PONV Risk Score and Plan: 2 and Propofol infusion and TIVA  Airway Management Planned: Natural Airway and Nasal Cannula  Additional Equipment:   Intra-op Plan:   Post-operative Plan:   Informed Consent: I have reviewed the patients History and Physical, chart, labs and discussed the procedure including the risks, benefits and alternatives for the proposed anesthesia with the patient or authorized representative who has indicated his/her understanding and acceptance.     Dental Advisory Given  Plan Discussed with: Anesthesiologist, CRNA and Surgeon  Anesthesia Plan Comments: (Patient consented for risks of anesthesia including  but not limited to:  - adverse reactions to medications - risk of airway placement if required - damage to eyes, teeth, lips or other oral mucosa - nerve damage due to positioning  - sore throat or hoarseness - Damage to heart, brain, nerves, lungs, other parts of body or loss of life  Patient voiced understanding.)       Anesthesia Quick Evaluation

## 2023-04-18 NOTE — Transfer of Care (Signed)
Immediate Anesthesia Transfer of Care Note  Patient: Miranda Daniels  Procedure(s) Performed: COLONOSCOPY WITH PROPOFOL POLYPECTOMY  Patient Location: PACU  Anesthesia Type:General  Level of Consciousness: drowsy and patient cooperative  Airway & Oxygen Therapy: Patient Spontanous Breathing and Patient connected to nasal cannula oxygen  Post-op Assessment: Report given to RN and Post -op Vital signs reviewed and stable  Post vital signs: stable  Last Vitals:  Vitals Value Taken Time  BP 116/69 04/18/23 1004  Temp    Pulse 72 04/18/23 1004  Resp 22 04/18/23 1004  SpO2 96 % 04/18/23 1004  Vitals shown include unfiled device data.  Last Pain:  Vitals:   04/18/23 1004  TempSrc:   PainSc: Asleep         Complications: No notable events documented.

## 2023-04-18 NOTE — Interval H&P Note (Signed)
History and Physical Interval Note:  04/18/2023 9:39 AM  Miranda Daniels  has presented today for surgery, with the diagnosis of Z12.11 (ICD-10-CM) - Colon cancer screening Z83.719 (ICD-10-CM) - Family history of colonic polyps.  The various methods of treatment have been discussed with the patient and family. After consideration of risks, benefits and other options for treatment, the patient has consented to  Procedure(s): COLONOSCOPY WITH PROPOFOL (N/A) as a surgical intervention.  The patient's history has been reviewed, patient examined, no change in status, stable for surgery.  I have reviewed the patient's chart and labs.  Questions were answered to the patient's satisfaction.     Regis Bill  Ok to proceed with colonoscopy

## 2023-04-21 ENCOUNTER — Encounter: Payer: Self-pay | Admitting: Gastroenterology

## 2023-04-23 DIAGNOSIS — Z720 Tobacco use: Secondary | ICD-10-CM | POA: Diagnosis not present

## 2023-04-23 DIAGNOSIS — J069 Acute upper respiratory infection, unspecified: Secondary | ICD-10-CM | POA: Diagnosis not present

## 2023-04-24 LAB — SURGICAL PATHOLOGY

## 2023-05-02 DIAGNOSIS — Z79899 Other long term (current) drug therapy: Secondary | ICD-10-CM | POA: Diagnosis not present

## 2023-05-02 DIAGNOSIS — I10 Essential (primary) hypertension: Secondary | ICD-10-CM | POA: Diagnosis not present

## 2023-05-02 DIAGNOSIS — Z6841 Body Mass Index (BMI) 40.0 and over, adult: Secondary | ICD-10-CM | POA: Diagnosis not present

## 2023-05-02 DIAGNOSIS — R030 Elevated blood-pressure reading, without diagnosis of hypertension: Secondary | ICD-10-CM | POA: Diagnosis not present

## 2023-05-02 DIAGNOSIS — Z23 Encounter for immunization: Secondary | ICD-10-CM | POA: Diagnosis not present

## 2023-05-05 DIAGNOSIS — L570 Actinic keratosis: Secondary | ICD-10-CM | POA: Diagnosis not present

## 2023-12-24 ENCOUNTER — Other Ambulatory Visit: Payer: Self-pay | Admitting: Student

## 2023-12-24 DIAGNOSIS — M25561 Pain in right knee: Secondary | ICD-10-CM

## 2023-12-24 DIAGNOSIS — M67971 Unspecified disorder of synovium and tendon, right ankle and foot: Secondary | ICD-10-CM

## 2023-12-25 ENCOUNTER — Other Ambulatory Visit: Payer: Self-pay | Admitting: Student

## 2023-12-25 DIAGNOSIS — M25561 Pain in right knee: Secondary | ICD-10-CM

## 2023-12-28 ENCOUNTER — Ambulatory Visit
Admission: RE | Admit: 2023-12-28 | Discharge: 2023-12-28 | Disposition: A | Discharge status: Home / Self Care | Source: Ambulatory Visit | Attending: Student | Admitting: Student

## 2023-12-28 ENCOUNTER — Ambulatory Visit
Admission: RE | Admit: 2023-12-28 | Discharge: 2023-12-28 | Disposition: A | Discharge status: Home / Self Care | Source: Ambulatory Visit | Attending: Student

## 2023-12-28 DIAGNOSIS — M67971 Unspecified disorder of synovium and tendon, right ankle and foot: Secondary | ICD-10-CM | POA: Insufficient documentation

## 2023-12-28 DIAGNOSIS — M25561 Pain in right knee: Secondary | ICD-10-CM | POA: Diagnosis present
